# Patient Record
Sex: Female | Born: 1970 | Race: Black or African American | Hispanic: No | Marital: Married | Smoking: Former smoker
Health system: Southern US, Community
[De-identification: ages and names within clinical notes are randomized; demographics above are authoritative.]

## PROBLEM LIST (undated history)

## (undated) ENCOUNTER — Inpatient Hospital Stay (HOSPITAL_COMMUNITY): Payer: Self-pay

## (undated) DIAGNOSIS — D649 Anemia, unspecified: Secondary | ICD-10-CM

## (undated) DIAGNOSIS — F329 Major depressive disorder, single episode, unspecified: Secondary | ICD-10-CM

## (undated) DIAGNOSIS — M1710 Unilateral primary osteoarthritis, unspecified knee: Secondary | ICD-10-CM

## (undated) DIAGNOSIS — R7303 Prediabetes: Secondary | ICD-10-CM

## (undated) DIAGNOSIS — R0602 Shortness of breath: Secondary | ICD-10-CM

## (undated) DIAGNOSIS — F32A Depression, unspecified: Secondary | ICD-10-CM

## (undated) HISTORY — DX: Depression, unspecified: F32.A

## (undated) HISTORY — DX: Prediabetes: R73.03

## (undated) HISTORY — DX: Shortness of breath: R06.02

## (undated) HISTORY — DX: Anemia, unspecified: D64.9

## (undated) HISTORY — DX: Major depressive disorder, single episode, unspecified: F32.9

## (undated) HISTORY — DX: Unilateral primary osteoarthritis, unspecified knee: M17.10

## (undated) HISTORY — PX: SMALL INTESTINE SURGERY: SHX150

---

## 2012-03-24 ENCOUNTER — Ambulatory Visit: Payer: Self-pay | Admitting: Obstetrics and Gynecology

## 2012-03-24 ENCOUNTER — Encounter: Payer: Self-pay | Admitting: Obstetrics and Gynecology

## 2012-03-24 ENCOUNTER — Ambulatory Visit: Payer: BC Managed Care – PPO | Admitting: Obstetrics and Gynecology

## 2012-03-24 VITALS — BP 110/82 | Ht 69.0 in | Wt 213.0 lb

## 2012-03-24 DIAGNOSIS — N6320 Unspecified lump in the left breast, unspecified quadrant: Secondary | ICD-10-CM

## 2012-03-24 DIAGNOSIS — Z8639 Personal history of other endocrine, nutritional and metabolic disease: Secondary | ICD-10-CM | POA: Insufficient documentation

## 2012-03-24 DIAGNOSIS — Z124 Encounter for screening for malignant neoplasm of cervix: Secondary | ICD-10-CM

## 2012-03-24 DIAGNOSIS — N920 Excessive and frequent menstruation with regular cycle: Secondary | ICD-10-CM

## 2012-03-24 LAB — HEMOGLOBIN: Hemoglobin: 10.8 g/dL — ABNORMAL LOW (ref 12.0–15.0)

## 2012-03-24 NOTE — Progress Notes (Signed)
Last Pap: 12/2009 WNL: Yes Regular Periods:yes Contraception: condoms   Monthly Breast exam:yes Tetanus<49yrs:yes Nl.Bladder Function:yes Daily BMs:yes Healthy Diet:yes Calcium:yes Mammogram:no Exercise:yes Have often Exercise: 1 time a week  Seatbelt: yes Abuse at home: no Stressful work:no PCP: Dr.Alam  Change in PMH: unchanged Change in AVW:[id number] . Pt stated no issues today.  Subjective:    Pamela Gray is a 42 y.o. female, G2P0011, who presents for an annual exam.  Has fatigue and ice pica    History   Social History  . Marital Status: Single    Spouse Name: N/A    Number of Children: N/A  . Years of Education: N/A   Social History Main Topics  . Smoking status: Former Smoker    Types: Cigarettes  . Smokeless tobacco: Never Used  . Alcohol Use: 4.8 oz/week    7 Glasses of wine, 1 Shots of liquor per week  . Drug Use: No  . Sexually Active: Yes    Birth Control/ Protection: Condom   Other Topics Concern  . None   Social History Narrative  . None    Menstrual cycle:   LMP: Patient's last menstrual period was 02/27/2012.           Cycle: regular every 28 days with 5 days heavy bleeding.  No IM bleeding  The following portions of the patient's history were reviewed and updated as appropriate: allergies, current medications, past family history, past medical history, past social history, past surgical history and problem list.  Review of Systems Pertinent items are noted in HPI. Breast:Negative for breast lump,nipple discharge or nipple retraction Gastrointestinal: Negative for abdominal pain, change in bowel habits or rectal bleeding Urinary:negative   Objective:    BP 110/82  Ht 5\' 9"  (1.753 m)  Wt 213 lb (96.616 kg)  BMI 31.45 kg/m2  LMP 02/27/2012    Weight:  Wt Readings from Last 1 Encounters:  03/24/12 213 lb (96.616 kg)          BMI: Body mass index is 31.45 kg/(m^2).  General Appearance: Alert, appropriate appearance for age. No  acute distress HEENT: Grossly normal Neck / Thyroid: Supple, no masses, nodes or enlargement Lungs: clear to auscultation bilaterally Back: No CVA tenderness Breast Exam: No masses or nodes.No dimpling, nipple retraction or discharge on right.  On left at 8 o'clock at periphery, 8mm mobile nontender nodule Cardiovascular: Regular rate and rhythm. S1, S2, no murmur Gastrointestinal: Soft, non-tender, no masses or organomegaly Pelvic Exam: Vulva and vagina appear normal. Bimanual exam reveals normal uterus and adnexa. Rectovaginal: normal rectal, no masses Lymphatic Exam: Non-palpable nodes in neck, clavicular, axillary, or inguinal regions Skin: no rash or abnormalities Neurologic: Normal gait and speech, no tremor  Psychiatric: Alert and oriented, appropriate affect.   Wet Prep:not applicable Urinalysis:not applicable UPT: Not done   Assessment:    breast mass  Heavy menses   Plan:    Mammogram- diagnostic and possible breast U/S pap smear with HR HPV HGB return annually or prn STD screening: discussed Contraception:condoms

## 2012-03-25 LAB — PAP IG AND HPV HIGH-RISK

## 2012-03-28 ENCOUNTER — Other Ambulatory Visit: Payer: Self-pay

## 2012-03-28 DIAGNOSIS — D649 Anemia, unspecified: Secondary | ICD-10-CM

## 2012-04-06 ENCOUNTER — Ambulatory Visit
Admission: RE | Admit: 2012-04-06 | Discharge: 2012-04-06 | Disposition: A | Discharge status: Home / Self Care | Payer: BC Managed Care – PPO | Source: Ambulatory Visit | Attending: Obstetrics and Gynecology | Admitting: Obstetrics and Gynecology

## 2012-04-06 ENCOUNTER — Encounter: Payer: Self-pay | Admitting: Obstetrics and Gynecology

## 2012-04-06 ENCOUNTER — Other Ambulatory Visit: Payer: Self-pay | Admitting: Obstetrics and Gynecology

## 2012-04-06 DIAGNOSIS — N6320 Unspecified lump in the left breast, unspecified quadrant: Secondary | ICD-10-CM

## 2012-04-06 DIAGNOSIS — R921 Mammographic calcification found on diagnostic imaging of breast: Secondary | ICD-10-CM | POA: Insufficient documentation

## 2012-04-06 DIAGNOSIS — R922 Inconclusive mammogram: Secondary | ICD-10-CM | POA: Insufficient documentation

## 2012-05-09 ENCOUNTER — Other Ambulatory Visit: Payer: BC Managed Care – PPO

## 2012-10-07 ENCOUNTER — Encounter (HOSPITAL_COMMUNITY): Payer: Self-pay

## 2012-10-07 ENCOUNTER — Inpatient Hospital Stay (HOSPITAL_COMMUNITY)
Admission: AD | Admit: 2012-10-07 | Discharge: 2012-10-07 | Disposition: A | Discharge status: Home / Self Care | Payer: BC Managed Care – PPO | Source: Ambulatory Visit | Attending: Obstetrics and Gynecology | Admitting: Obstetrics and Gynecology

## 2012-10-07 DIAGNOSIS — D259 Leiomyoma of uterus, unspecified: Secondary | ICD-10-CM | POA: Insufficient documentation

## 2012-10-07 DIAGNOSIS — R109 Unspecified abdominal pain: Secondary | ICD-10-CM | POA: Insufficient documentation

## 2012-10-07 DIAGNOSIS — O99891 Other specified diseases and conditions complicating pregnancy: Secondary | ICD-10-CM | POA: Insufficient documentation

## 2012-10-07 DIAGNOSIS — O210 Mild hyperemesis gravidarum: Secondary | ICD-10-CM | POA: Insufficient documentation

## 2012-10-07 DIAGNOSIS — O3410 Maternal care for benign tumor of corpus uteri, unspecified trimester: Secondary | ICD-10-CM | POA: Insufficient documentation

## 2012-10-07 LAB — URINALYSIS, ROUTINE W REFLEX MICROSCOPIC
Bilirubin Urine: NEGATIVE
Glucose, UA: NEGATIVE mg/dL
Ketones, ur: NEGATIVE mg/dL
Leukocytes, UA: NEGATIVE
Protein, ur: NEGATIVE mg/dL
pH: 6 (ref 5.0–8.0)

## 2012-10-07 LAB — URINE MICROSCOPIC-ADD ON

## 2012-10-07 NOTE — MAU Provider Note (Signed)
  History     CSN: [id number]  Arrival date and time: 10/07/12 0041   None     Chief Complaint  Patient presents with  . Abdominal Cramping   HPI Comments: Pt is a G4P1 at [redacted]w[redacted]d by [place] w cc cramping, worsening today, denies any VB or LOF. Reports N/V improving today. States was at work all day and cramping increasing tonight, admits to not drinking much water.   Rv'd prenatal records, NOB w/u on 8/11. [place] on 7/23 = EDC 04/24/12, US showed several fibroids, largest >4cmx9cm, otherwise SIUP WNL     Abdominal Cramping      Past Medical History  Diagnosis Date  . Depression     Past Surgical History  Procedure Laterality Date  . Small intestine surgery    . Cesarean section      Family History  Problem Relation Age of Onset  . Hypertension Mother   . Hyperlipidemia Mother   . Stroke Father   . Hypertension Father   . Diabetes Father   . Prostate cancer Father   . Diabetes Maternal Grandmother   . Hypertension Maternal Grandmother   . Hyperlipidemia Maternal Grandmother   . Diabetes Maternal Grandfather   . Prostate cancer Maternal Grandfather   . Diabetes Paternal Grandfather   . Prostate cancer Paternal Grandfather   . Hypertension Paternal Grandfather     History  Substance Use Topics  . Smoking status: Former Smoker    Types: Cigarettes  . Smokeless tobacco: Never Used  . Alcohol Use: 4.8 oz/week    7 Glasses of wine, 1 Shots of liquor per week    Allergies: No Known Allergies  No prescriptions prior to admission    Review of Systems  All other systems reviewed and are negative.   Physical Exam   Blood pressure 130/82, pulse 97, temperature 97.9 F (36.6 C), temperature source Oral, resp. rate 18, height 5\' 8"  (1.727 m), weight 222 lb 6.4 oz (100.88 kg), last menstrual period 07/27/2012, SpO2 100.00%, unknown if currently breastfeeding.  Physical Exam  Nursing note and vitals reviewed. Constitutional: She is oriented to person, place, and time.  She appears well-developed and well-nourished. No distress.  NAD   HENT:  Head: Normocephalic.  Cardiovascular: Normal rate.   Respiratory: Effort normal.  GI: Soft. She exhibits no distension. There is no tenderness. There is no rebound and no guarding.  Genitourinary:  Deferred   Musculoskeletal: Normal range of motion.  Neurological: She is alert and oriented to person, place, and time.  Skin: Skin is warm and dry.  Psychiatric: She has a normal mood and affect. Her behavior is normal.    MAU Course  Procedures    Assessment and Plan  IUP at [redacted]w[redacted]d Multiple fibroids BSUS by me, FHR 160's and +FM, (pt and sig other reassured) Declines pelvic exam  dc'd home in stable condition w routine f/u as scheduled  rv'd s/s miscarriage Enc PO hydration Enc pt to call before come to [facility]    Martisha Toulouse M 10/07/2012, 8:24 AM

## 2012-10-07 NOTE — MAU Note (Signed)
Pt states some cramping that started around 11am. Had OB appointment on Tuesday and was told to expect cramping, but tonight it got much worse and was told to come in to MAU. Some vaginal discharge but no bleeding. Denies N/V

## 2013-03-21 ENCOUNTER — Other Ambulatory Visit: Payer: Self-pay

## 2013-03-29 ENCOUNTER — Other Ambulatory Visit: Payer: Self-pay

## 2013-03-30 ENCOUNTER — Other Ambulatory Visit: Payer: Self-pay | Admitting: Obstetrics and Gynecology

## 2013-03-31 ENCOUNTER — Ambulatory Visit (HOSPITAL_COMMUNITY)
Admission: RE | Admit: 2013-03-31 | Discharge: 2013-03-31 | Disposition: A | Discharge status: Home / Self Care | Payer: BC Managed Care – PPO | Source: Ambulatory Visit | Attending: Obstetrics and Gynecology | Admitting: Obstetrics and Gynecology

## 2013-03-31 VITALS — BP 130/88 | HR 90 | Wt 252.0 lb

## 2013-03-31 DIAGNOSIS — O26839 Pregnancy related renal disease, unspecified trimester: Secondary | ICD-10-CM | POA: Insufficient documentation

## 2013-03-31 DIAGNOSIS — O99810 Abnormal glucose complicating pregnancy: Secondary | ICD-10-CM | POA: Insufficient documentation

## 2013-03-31 DIAGNOSIS — Z87891 Personal history of nicotine dependence: Secondary | ICD-10-CM | POA: Insufficient documentation

## 2013-03-31 DIAGNOSIS — Z794 Long term (current) use of insulin: Secondary | ICD-10-CM | POA: Insufficient documentation

## 2013-03-31 DIAGNOSIS — O24419 Gestational diabetes mellitus in pregnancy, unspecified control: Secondary | ICD-10-CM

## 2013-03-31 DIAGNOSIS — O34219 Maternal care for unspecified type scar from previous cesarean delivery: Secondary | ICD-10-CM | POA: Insufficient documentation

## 2013-03-31 NOTE — Consult Note (Signed)
Maternal Fetal Medicine Consultation  Requesting Provider(s): Everett Graff, MD  Reason for consultation: Class B diabetes, new onset proteinuria - recommendations for timing of delivery  HPI: Pamela Gray is a 43 yo G3P1011 currently at 80 4/7 weeks who is referred for consultation and recommendations for timing of delivery due to class B diabetes and new onset gestational proteinuria.  The patient reports a history of gestational diabetes with her first pregnancy, but was never tested after delivery.  The patient was diagnosed with GDM at approximately 30 weeks - her HbA1C in December was 7.  She is currently on split dose insulin and is generally well-controlled.  The patient was recently noted to have proteinuria on urine dip - Pro/Creat ratio was 0.73 and 24 hr urine protein on 1/29 was 1848 mg/24 hrs.  The patient's blood pressures have been stable - mostly in the 120's/70's but have recently increased to 130's / 80's.  Preeclampsia labs on 2/4 were within normal limits except for a uric acid of 6.3.  Ultrasound on 2/4 showed an estimated fetal weight of 3515 g (95th %tile).  She is tentatively scheduled for repeat C-section at 39 weeks.  Pamela Gray reports some increased lower extremity swelling - denies HA, RUQ pain or visual changes.  She has been getting 2x weekly antepartum fetal testing that has been reassuring.  She is otherwise without complaints today.  OB History: OB History   Grav Para Term Preterm Abortions TAB SAB Ect Mult Living   4 1 1  1     1     G1- TAB age 44 G2 - C-section at term for non reassuring fetal tracing  PMH:  Past Medical History  Diagnosis Date  . Depression     PSH:  Past Surgical History  Procedure Laterality Date  . Small intestine surgery    . Cesarean section     Meds:  Novalin / Novalog insulin Protonix Benadryl Diclegis  Allergies: No Known Allergies  FH:  Family History  Problem Relation Age of Onset  . Hypertension Mother    . Hyperlipidemia Mother   . Stroke Father   . Hypertension Father   . Diabetes Father   . Prostate cancer Father   . Diabetes Maternal Grandmother   . Hypertension Maternal Grandmother   . Hyperlipidemia Maternal Grandmother   . Diabetes Maternal Grandfather   . Prostate cancer Maternal Grandfather   . Diabetes Paternal Grandfather   . Prostate cancer Paternal Grandfather   . Hypertension Paternal Grandfather    Soc:  History   Social History  . Marital Status: Married    Spouse Name: N/A    Number of Children: N/A  . Years of Education: N/A   Occupational History  . Not on file.   Social History Main Topics  . Smoking status: Former Smoker    Types: Cigarettes  . Smokeless tobacco: Never Used  . Alcohol Use: 4.8 oz/week    7 Glasses of wine, 1 Shots of liquor per week  . Drug Use: No  . Sexual Activity: Yes    Birth Control/ Protection: Condom   Other Topics Concern  . Not on file   Social History Narrative  . No narrative on file    Review of Systems: no vaginal bleeding or cramping/contractions, no LOF, no nausea/vomiting. All other systems reviewed and are negative.  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  EFW > 90th %tile on most recent ultrasound.         3) Gestational proteinuria - do not have baseline 24-hr urine, but this is likely a new finding. BPs to date have been stable, but I would suspect that the patient is developing preeclampsia given new onset proteinuria and a mildly elevated uric acid.         4) Hx of prior C-section, desires repeat  Recommendations: 1) Continue close outpatient follow up- BP checks at least twice weekly.  The patient is to be seen in clinic on Monday for BP checks.  If SBP > 140 or DBP > 90 persistently, would recommend moving toward delivery anytime  after 37 0/7 weeks.  If there is any question prior to 37 weeks, would have a low threshold to admit for inpatient observation. 2) 2x weekly NSTs with weekly AFIs  3) Would check weekly preeclampsia labs - would move toward delivery for elevated LFTs, plts < 100, Creat > 1.2 4) If testing and BPs otherwise remain stable, would deliver no later than 39 weeks.  Preeclampsia precautions given - to come to MAU or clinic for HA, visual changes or RUQ pain.   Thank you for the opportunity to be a part of the care of [facility]. Please contact our office if we can be of further assistance.   I spent approximately 30 minutes with this patient with over 50% of time spent in face-to-face counseling.  Benjaman Lobe, MD Maternal Fetal Medicine

## 2013-04-03 ENCOUNTER — Encounter (HOSPITAL_COMMUNITY): Payer: Self-pay | Admitting: Pharmacist

## 2013-04-04 ENCOUNTER — Inpatient Hospital Stay (HOSPITAL_COMMUNITY): Payer: BC Managed Care – PPO | Admitting: Anesthesiology

## 2013-04-04 ENCOUNTER — Encounter (HOSPITAL_COMMUNITY): Payer: BC Managed Care – PPO | Admitting: Anesthesiology

## 2013-04-04 ENCOUNTER — Inpatient Hospital Stay (HOSPITAL_COMMUNITY)
Admission: AD | Admit: 2013-04-04 | Discharge: 2013-04-08 | DRG: 765 | Disposition: A | Discharge status: Home / Self Care | Payer: BC Managed Care – PPO | Source: Ambulatory Visit | Attending: Obstetrics and Gynecology | Admitting: Obstetrics and Gynecology

## 2013-04-04 ENCOUNTER — Encounter (HOSPITAL_COMMUNITY)
Admission: AD | Disposition: A | Discharge status: Home / Self Care | Payer: Self-pay | Source: Ambulatory Visit | Attending: Obstetrics and Gynecology

## 2013-04-04 ENCOUNTER — Encounter (HOSPITAL_COMMUNITY): Payer: Self-pay | Admitting: *Deleted

## 2013-04-04 DIAGNOSIS — E669 Obesity, unspecified: Secondary | ICD-10-CM | POA: Diagnosis present

## 2013-04-04 DIAGNOSIS — D649 Anemia, unspecified: Secondary | ICD-10-CM | POA: Diagnosis present

## 2013-04-04 DIAGNOSIS — O99214 Obesity complicating childbirth: Secondary | ICD-10-CM

## 2013-04-04 DIAGNOSIS — IMO0002 Reserved for concepts with insufficient information to code with codable children: Secondary | ICD-10-CM | POA: Diagnosis not present

## 2013-04-04 DIAGNOSIS — F3289 Other specified depressive episodes: Secondary | ICD-10-CM | POA: Diagnosis present

## 2013-04-04 DIAGNOSIS — F329 Major depressive disorder, single episode, unspecified: Secondary | ICD-10-CM | POA: Diagnosis present

## 2013-04-04 DIAGNOSIS — O3410 Maternal care for benign tumor of corpus uteri, unspecified trimester: Secondary | ICD-10-CM

## 2013-04-04 DIAGNOSIS — O09529 Supervision of elderly multigravida, unspecified trimester: Secondary | ICD-10-CM | POA: Diagnosis present

## 2013-04-04 DIAGNOSIS — O24419 Gestational diabetes mellitus in pregnancy, unspecified control: Secondary | ICD-10-CM | POA: Diagnosis present

## 2013-04-04 DIAGNOSIS — O1490 Unspecified pre-eclampsia, unspecified trimester: Secondary | ICD-10-CM | POA: Diagnosis present

## 2013-04-04 DIAGNOSIS — O99344 Other mental disorders complicating childbirth: Secondary | ICD-10-CM | POA: Diagnosis present

## 2013-04-04 DIAGNOSIS — O9852 Other viral diseases complicating childbirth: Secondary | ICD-10-CM

## 2013-04-04 DIAGNOSIS — O34599 Maternal care for other abnormalities of gravid uterus, unspecified trimester: Secondary | ICD-10-CM | POA: Diagnosis present

## 2013-04-04 DIAGNOSIS — Z794 Long term (current) use of insulin: Secondary | ICD-10-CM

## 2013-04-04 DIAGNOSIS — Z6836 Body mass index (BMI) 36.0-36.9, adult: Secondary | ICD-10-CM

## 2013-04-04 DIAGNOSIS — O34219 Maternal care for unspecified type scar from previous cesarean delivery: Principal | ICD-10-CM | POA: Diagnosis present

## 2013-04-04 DIAGNOSIS — D4959 Neoplasm of unspecified behavior of other genitourinary organ: Secondary | ICD-10-CM | POA: Diagnosis present

## 2013-04-04 DIAGNOSIS — O321XX0 Maternal care for breech presentation, not applicable or unspecified: Secondary | ICD-10-CM | POA: Diagnosis present

## 2013-04-04 DIAGNOSIS — O99814 Abnormal glucose complicating childbirth: Secondary | ICD-10-CM | POA: Diagnosis present

## 2013-04-04 DIAGNOSIS — O9902 Anemia complicating childbirth: Secondary | ICD-10-CM | POA: Diagnosis present

## 2013-04-04 DIAGNOSIS — Z302 Encounter for sterilization: Secondary | ICD-10-CM

## 2013-04-04 DIAGNOSIS — Z98891 History of uterine scar from previous surgery: Secondary | ICD-10-CM

## 2013-04-04 DIAGNOSIS — D259 Leiomyoma of uterus, unspecified: Secondary | ICD-10-CM | POA: Diagnosis present

## 2013-04-04 DIAGNOSIS — B069 Rubella without complication: Secondary | ICD-10-CM | POA: Diagnosis present

## 2013-04-04 HISTORY — PX: BILATERAL SALPINGECTOMY: SHX5743

## 2013-04-04 LAB — CBC
HEMATOCRIT: 25 % — AB (ref 36.0–46.0)
HEMOGLOBIN: 7.8 g/dL — AB (ref 12.0–15.0)
MCH: 19.1 pg — ABNORMAL LOW (ref 26.0–34.0)
MCHC: 31.2 g/dL (ref 30.0–36.0)
MCV: 61.3 fL — ABNORMAL LOW (ref 78.0–100.0)
Platelets: 466 10*3/uL — ABNORMAL HIGH (ref 150–400)
RBC: 4.08 MIL/uL (ref 3.87–5.11)
RDW: 19.3 % — ABNORMAL HIGH (ref 11.5–15.5)
WBC: 10.2 10*3/uL (ref 4.0–10.5)

## 2013-04-04 LAB — PROTEIN / CREATININE RATIO, URINE
CREATININE, URINE: 253.29 mg/dL
PROTEIN CREATININE RATIO: 1.18 — AB (ref 0.00–0.15)
Total Protein, Urine: 298.9 mg/dL

## 2013-04-04 LAB — COMPREHENSIVE METABOLIC PANEL
ALT: 8 U/L (ref 0–35)
AST: 21 U/L (ref 0–37)
Albumin: 2.7 g/dL — ABNORMAL LOW (ref 3.5–5.2)
Alkaline Phosphatase: 94 U/L (ref 39–117)
BUN: 8 mg/dL (ref 6–23)
CALCIUM: 8.9 mg/dL (ref 8.4–10.5)
CO2: 21 mEq/L (ref 19–32)
CREATININE: 0.68 mg/dL (ref 0.50–1.10)
Chloride: 101 mEq/L (ref 96–112)
GFR calc non Af Amer: >90 mL/min (ref >90)
GLUCOSE: 72 mg/dL (ref 70–99)
Potassium: 3.6 mEq/L — ABNORMAL LOW (ref 3.7–5.3)
SODIUM: 135 meq/L — AB (ref 137–147)
TOTAL PROTEIN: 6.7 g/dL (ref 6.0–8.3)
Total Bilirubin: 0.6 mg/dL (ref 0.3–1.2)

## 2013-04-04 LAB — LACTATE DEHYDROGENASE: LDH: 204 U/L (ref 94–250)

## 2013-04-04 LAB — GLUCOSE, CAPILLARY: Glucose-Capillary: 62 mg/dL — ABNORMAL LOW (ref 70–99)

## 2013-04-04 LAB — PREPARE RBC (CROSSMATCH)

## 2013-04-04 LAB — ABO/RH: ABO/RH(D): O POS

## 2013-04-04 LAB — URIC ACID: URIC ACID, SERUM: 6.4 mg/dL (ref 2.4–7.0)

## 2013-04-04 SURGERY — Surgical Case
Anesthesia: Spinal | Site: Abdomen

## 2013-04-04 MED ORDER — MAGNESIUM SULFATE BOLUS VIA INFUSION
4.0000 g | Freq: Once | INTRAVENOUS | Status: AC
  Administered 2013-04-05: 4 g via INTRAVENOUS
  Filled 2013-04-04: qty 500

## 2013-04-04 MED ORDER — CEFAZOLIN SODIUM-DEXTROSE 2-3 GM-% IV SOLR
2.0000 g | INTRAVENOUS | Status: AC
  Administered 2013-04-04: 2 g via INTRAVENOUS
  Filled 2013-04-04: qty 50

## 2013-04-04 MED ORDER — KETOROLAC TROMETHAMINE 30 MG/ML IJ SOLN
30.0000 mg | Freq: Four times a day (QID) | INTRAMUSCULAR | Status: AC | PRN
  Administered 2013-04-05: 30 mg via INTRAMUSCULAR

## 2013-04-04 MED ORDER — OXYTOCIN 10 UNIT/ML IJ SOLN
INTRAMUSCULAR | Status: AC
  Filled 2013-04-04: qty 4

## 2013-04-04 MED ORDER — PHENYLEPHRINE 8 MG IN D5W 100 ML (0.08MG/ML) PREMIX OPTIME
INJECTION | INTRAVENOUS | Status: DC | PRN
  Administered 2013-04-04: 60 ug/min via INTRAVENOUS

## 2013-04-04 MED ORDER — MORPHINE SULFATE 0.5 MG/ML IJ SOLN
INTRAMUSCULAR | Status: AC
  Filled 2013-04-04: qty 10

## 2013-04-04 MED ORDER — OXYTOCIN 10 UNIT/ML IJ SOLN
40.0000 [IU] | INTRAVENOUS | Status: DC | PRN
  Administered 2013-04-04 (×2): 40 [IU] via INTRAVENOUS

## 2013-04-04 MED ORDER — PHENYLEPHRINE 40 MCG/ML (10ML) SYRINGE FOR IV PUSH (FOR BLOOD PRESSURE SUPPORT)
PREFILLED_SYRINGE | INTRAVENOUS | Status: AC
  Filled 2013-04-04: qty 5

## 2013-04-04 MED ORDER — CITRIC ACID-SODIUM CITRATE 334-500 MG/5ML PO SOLN
30.0000 mL | Freq: Once | ORAL | Status: DC
  Filled 2013-04-04: qty 15

## 2013-04-04 MED ORDER — MEPERIDINE HCL 25 MG/ML IJ SOLN: 6.2500 mg | INTRAMUSCULAR | Status: DC | PRN

## 2013-04-04 MED ORDER — KETOROLAC TROMETHAMINE 30 MG/ML IJ SOLN: 30.0000 mg | Freq: Four times a day (QID) | INTRAMUSCULAR | Status: AC | PRN

## 2013-04-04 MED ORDER — MORPHINE SULFATE (PF) 0.5 MG/ML IJ SOLN
INTRAMUSCULAR | Status: DC | PRN
  Administered 2013-04-04: .15 mg via EPIDURAL

## 2013-04-04 MED ORDER — BUPIVACAINE IN DEXTROSE 0.75-8.25 % IT SOLN
INTRATHECAL | Status: AC
  Filled 2013-04-04: qty 2

## 2013-04-04 MED ORDER — SCOPOLAMINE 1 MG/3DAYS TD PT72
1.0000 | MEDICATED_PATCH | Freq: Once | TRANSDERMAL | Status: AC
  Administered 2013-04-05: 1.5 mg via TRANSDERMAL

## 2013-04-04 MED ORDER — FENTANYL CITRATE 0.05 MG/ML IJ SOLN
INTRAMUSCULAR | Status: AC
  Filled 2013-04-04: qty 2

## 2013-04-04 MED ORDER — KETOROLAC TROMETHAMINE 30 MG/ML IJ SOLN
INTRAMUSCULAR | Status: AC
  Administered 2013-04-05: 30 mg via INTRAMUSCULAR
  Filled 2013-04-04: qty 1

## 2013-04-04 MED ORDER — SODIUM BICARBONATE 8.4 % IV SOLN
INTRAVENOUS | Status: DC | PRN
  Administered 2013-04-04: 2 mL via EPIDURAL

## 2013-04-04 MED ORDER — MAGNESIUM SULFATE 40 G IN LACTATED RINGERS - SIMPLE
2.0000 g/h | INTRAVENOUS | Status: AC
  Filled 2013-04-04: qty 500

## 2013-04-04 MED ORDER — LACTATED RINGERS IV SOLN
INTRAVENOUS | Status: DC | PRN
  Administered 2013-04-04 (×3): via INTRAVENOUS

## 2013-04-04 MED ORDER — SCOPOLAMINE 1 MG/3DAYS TD PT72
MEDICATED_PATCH | TRANSDERMAL | Status: AC
  Administered 2013-04-05: 1.5 mg via TRANSDERMAL
  Filled 2013-04-04: qty 1

## 2013-04-04 MED ORDER — LABETALOL HCL 5 MG/ML IV SOLN: 10.0000 mg | Freq: Once | INTRAVENOUS | Status: DC

## 2013-04-04 MED ORDER — HYDROMORPHONE HCL PF 1 MG/ML IJ SOLN
0.2500 mg | INTRAMUSCULAR | Status: DC | PRN
  Administered 2013-04-05 (×2): 0.5 mg via INTRAVENOUS

## 2013-04-04 MED ORDER — CITRIC ACID-SODIUM CITRATE 334-500 MG/5ML PO SOLN
ORAL | Status: AC
  Filled 2013-04-04: qty 15

## 2013-04-04 MED ORDER — ONDANSETRON HCL 4 MG/2ML IJ SOLN
INTRAMUSCULAR | Status: DC | PRN
  Administered 2013-04-04: 4 mg via INTRAVENOUS

## 2013-04-04 MED ORDER — DEXTROSE-NACL 5-0.45 % IV SOLN
INTRAVENOUS | Status: DC
  Administered 2013-04-04 – 2013-04-05 (×3): via INTRAVENOUS

## 2013-04-04 MED ORDER — BUPIVACAINE-EPINEPHRINE 0.5% -1:200000 IJ SOLN
INTRAMUSCULAR | Status: DC | PRN
  Administered 2013-04-04: 10 mL

## 2013-04-04 SURGICAL SUPPLY — 39 items
CLAMP CORD UMBIL (MISCELLANEOUS) IMPLANT
CLOTH BEACON ORANGE TIMEOUT ST (SAFETY) ×4 IMPLANT
CONTAINER PREFILL 10% NBF 15ML (MISCELLANEOUS) IMPLANT
DRAIN JACKSON PRT FLT 7MM (DRAIN) IMPLANT
DRAPE LG THREE QUARTER DISP (DRAPES) IMPLANT
DRSG OPSITE POSTOP 4X10 (GAUZE/BANDAGES/DRESSINGS) ×4 IMPLANT
DURAPREP 26ML APPLICATOR (WOUND CARE) ×4 IMPLANT
ELECT REM PT RETURN 9FT ADLT (ELECTROSURGICAL) ×4
ELECTRODE REM PT RTRN 9FT ADLT (ELECTROSURGICAL) ×2 IMPLANT
EVACUATOR SILICONE 100CC (DRAIN) IMPLANT
EXTRACTOR VACUUM M CUP 4 TUBE (SUCTIONS) IMPLANT
EXTRACTOR VACUUM M CUP 4' TUBE (SUCTIONS)
GLOVE BIOGEL PI IND STRL 8.5 (GLOVE) ×2 IMPLANT
GLOVE BIOGEL PI INDICATOR 8.5 (GLOVE) ×2
GLOVE ECLIPSE 8.0 STRL XLNG CF (GLOVE) ×8 IMPLANT
GOWN STRL REUS W/TWL LRG LVL3 (GOWN DISPOSABLE) ×12 IMPLANT
KIT ABG SYR 3ML LUER SLIP (SYRINGE) IMPLANT
NEEDLE HYPO 25X1 1.5 SAFETY (NEEDLE) ×4 IMPLANT
NEEDLE HYPO 25X5/8 SAFETYGLIDE (NEEDLE) IMPLANT
PACK C SECTION WH (CUSTOM PROCEDURE TRAY) ×4 IMPLANT
PAD ABD 7.5X8 STRL (GAUZE/BANDAGES/DRESSINGS) ×4 IMPLANT
PAD OB MATERNITY 4.3X12.25 (PERSONAL CARE ITEMS) ×4 IMPLANT
RINGERS IRRIG 1000ML POUR BTL (IV SOLUTION) ×4 IMPLANT
STAPLER VISISTAT 35W (STAPLE) IMPLANT
SUT MNCRL AB 3-0 PS2 27 (SUTURE) ×4 IMPLANT
SUT PLAIN 0 NONE (SUTURE) ×4 IMPLANT
SUT SILK 3 0 FS 1X18 (SUTURE) IMPLANT
SUT VIC AB 0 CT1 27 (SUTURE) ×6
SUT VIC AB 0 CT1 27XBRD ANBCTR (SUTURE) ×6 IMPLANT
SUT VIC AB 2-0 CTX 36 (SUTURE) ×8 IMPLANT
SUT VIC AB 3-0 CT1 27 (SUTURE)
SUT VIC AB 3-0 CT1 TAPERPNT 27 (SUTURE) IMPLANT
SUT VIC AB 3-0 SH 27 (SUTURE)
SUT VIC AB 3-0 SH 27X BRD (SUTURE) IMPLANT
SYR CONTROL 10ML LL (SYRINGE) ×4 IMPLANT
TAPE CLOTH SURG 4X10 WHT LF (GAUZE/BANDAGES/DRESSINGS) ×4 IMPLANT
TOWEL OR 17X24 6PK STRL BLUE (TOWEL DISPOSABLE) ×4 IMPLANT
TRAY FOLEY CATH 14FR (SET/KITS/TRAYS/PACK) ×4 IMPLANT
WATER STERILE IRR 1000ML POUR (IV SOLUTION) IMPLANT

## 2013-04-04 NOTE — Transfer of Care (Signed)
Immediate Anesthesia Transfer of Care Note  Patient: [facility]  Procedure(s) Performed: Procedure(s): CESAREAN SECTION (N/A) BILATERAL SALPINGECTOMY  Patient Location: PACU  Anesthesia Type:Spinal  Level of Consciousness: awake, alert  and patient cooperative  Airway & Oxygen Therapy: Patient Spontanous Breathing  Post-op Assessment: Report given to PACU RN and Post -op Vital signs reviewed and stable  Post vital signs: Reviewed and stable  Complications: No apparent anesthesia complications

## 2013-04-04 NOTE — Anesthesia Procedure Notes (Signed)
Spinal  Patient location during procedure: OR Preanesthetic Checklist Completed: patient identified, site marked, surgical consent, pre-op evaluation, timeout performed, IV checked, risks and benefits discussed and monitors and equipment checked Spinal Block Patient position: sitting Prep: DuraPrep Patient monitoring: cardiac monitor, continuous pulse ox, blood pressure and heart rate Approach: midline Location: L3-4 Injection technique: catheter Needle Needle type: Tuohy and Sprotte  Needle gauge: 24 G Needle length: 12.7 cm Needle insertion depth: 8 cm Catheter type: closed end flexible Catheter size: 19 g Catheter at skin depth: 15 cm Additional Notes Spinal Dosage in OR  Bupivicaine ml       1.9 PFMS04   mcg        150 (-) asp Heme/CSF

## 2013-04-04 NOTE — Progress Notes (Signed)
Pt states she started seeing stars about 2 weeks ago

## 2013-04-04 NOTE — H&P (Signed)
Admission history and physical exam 04/04/13  [name] is a 43 y.o. female, G4P1011, at [redacted]w[redacted]d gestation, who presents for evaluation of blood pressure. She has been followed at the [facility]cology division of [facility]. Her pregnancy has been complicated by  Proteinuria  Prior cesarean section  Bowel obstruction after cesarean section requiring surgery  Advanced maternal age  Anemia  Breech presentation  Gestational Diabetes requiring insulin  Obesity  Depression  Rubella nonimmune  Desires sterilization.  The patient denies headaches, blurred vision, and right upper quadrant tenderness. Her urine protein to creatinine ratio 03/20/2013 was 0.73. Her urine protein to creatinine ratio on 03/29/2013 was 0.5. Her hemoglobin on 03/29/2013 was 8.2. She currently uses insulin to manage her blood sugars. Her hemoglobin A1c was 7 when last checked. She had gestational diabetes with her first pregnancy. She says that she was checked for diabetes between pregnancies and was always told that her tests were normal. She has had antenatal testing that confirms fetal well-being. Her blood pressure in the office today was 148/90. This was after an active day. Her mother says that her blood pressures have been elevated at home using the automated blood pressure cuff.  See history below.  OB History    Grav  Para  Term  Preterm  Abortions  TAB  SAB  Ect  Mult  Living    4  1  1   1      1       Past Medical History   Diagnosis  Date   .  Depression     Prescriptions prior to admission   Medication  Sig  Dispense  Refill   .  diphenhydrAMINE (SOMINEX) 25 MG tablet  Take 25 mg by mouth at bedtime as needed for sleep.     .  insulin aspart (NOVOLOG) 100 UNIT/ML injection  Inject into the skin 3 (three) times daily before meals.     .  insulin lispro (HUMALOG) 100 UNIT/ML injection  Inject into the skin 3 (three) times daily before meals.     .   flintstones complete (FLINTSTONES) 60 MG chewable tablet  Chew 1 tablet by mouth daily.      Past Surgical History   Procedure  Laterality  Date   .  Small intestine surgery     .  Cesarean section      No Known Allergies  Family History: family history includes Diabetes in her father, maternal grandfather, maternal grandmother, and paternal grandfather; Hyperlipidemia in her maternal grandmother and mother; Hypertension in her father, maternal grandmother, mother, and paternal grandfather; Prostate cancer in her father, maternal grandfather, and paternal grandfather; Stroke in her father.  Social History: reports that she has quit smoking. Her smoking use included Cigarettes. She smoked 0.00 packs per day. She has never used smokeless tobacco. She reports that she drinks about 4.8 ounces of alcohol per week. She reports that she does not use illicit drugs.  Review of systems: Normal pregnancy complaints.  Admission Physical Exam:  Body mass index is 36.99 kg/(m^2).  Height 5\' 9"  (1.753 m), weight 250 lb 9.6 oz (113.671 kg), last menstrual period 07/27/2012, unknown if currently breastfeeding.  HEENT: Within normal limits  Chest: Clear  Heart: Regular rate and rhythm  Abdomen: Gravid and nontender  Extremities: Grossly normal  Neurologic exam: Grossly normal  Prenatal labs:  ABO, Rh: O+  HBsAg: NR  HIV: NR  GBS: Neg  Antibody: Neg  Rubella:  Non-Immune  RPR: NR  Fetal heart rate: Category 1     Prenatal Transfer Tool   Maternal Diabetes: Yes: Diabetes Type: Insulin/Medication controlled  Genetic Screening: Normal  Maternal Ultrasounds/Referrals: Breech, fibroids  Fetal Ultrasounds or other Referrals: None  Maternal Substance Abuse: No  Significant Maternal Medications: Meds include: Other: Insulin  Significant Maternal Lab Results: See HPI and results  Other Comments: TDAP given  Assessment:  [redacted]w[redacted]d gestation  Proteinuria  Prior cesarean section  Bowel obstruction after  cesarean section requiring surgery  Advanced maternal age  Anemia  Breech presentation  Gestational Diabetes requiring insulin  Obesity  Depression  Rubella nonimmune  Desires sterilization.  Plan:  We will check [place] labs including a protein creatinine ratio. If the patient needs the criteria for preeclampsia, then we will proceed with cesarean delivery and tubal sterilization by way of salpingectomy tonight. If the patient does not meet the criteria, then we will plan to do her cesarean section as scheduled. We will continue antenatal surveillance.  Floriene Jeschke V  04/04/2013, 5:01 PM

## 2013-04-04 NOTE — MAU Note (Signed)
Pt states her blood pressure has been elevated for the past 3 days

## 2013-04-04 NOTE — Progress Notes (Signed)
BP 131/82  Pulse 83  Ht 5\' 9"  (1.753 m)  Wt 250 lb 9.6 oz (113.671 kg)  BMI 36.99 kg/m2  LMP 07/27/2012  Results for orders placed during the hospital encounter of 04/04/13 (from the past 24 hour(s))  PROTEIN / CREATININE RATIO, URINE     Status: Abnormal   Collection Time    04/04/13  4:15 PM      Result Value Range   Creatinine, Urine 253.29     Total Protein, Urine 298.9     PROTEIN CREATININE RATIO 1.18 (*) 0.00 - 0.15  COMPREHENSIVE METABOLIC PANEL     Status: Abnormal   Collection Time    04/04/13  5:12 PM      Result Value Range   Sodium 135 (*) 137 - 147 mEq/L   Potassium 3.6 (*) 3.7 - 5.3 mEq/L   Chloride 101  96 - 112 mEq/L   CO2 21  19 - 32 mEq/L   Glucose, Bld 72  70 - 99 mg/dL   BUN 8  6 - 23 mg/dL   Creatinine, Ser 0.68  0.50 - 1.10 mg/dL   Calcium 8.9  8.4 - 10.5 mg/dL   Total Protein 6.7  6.0 - 8.3 g/dL   Albumin 2.7 (*) 3.5 - 5.2 g/dL   AST 21  0 - 37 U/L   ALT 8  0 - 35 U/L   Alkaline Phosphatase 94  39 - 117 U/L   Total Bilirubin 0.6  0.3 - 1.2 mg/dL   GFR calc non Af Amer >90  >90 mL/min   GFR calc Af Amer >90  >90 mL/min  CBC     Status: Abnormal   Collection Time    04/04/13  5:12 PM      Result Value Range   WBC 10.2  4.0 - 10.5 K/uL   RBC 4.08  3.87 - 5.11 MIL/uL   Hemoglobin 7.8 (*) 12.0 - 15.0 g/dL   HCT 25.0 (*) 36.0 - 46.0 %   MCV 61.3 (*) 78.0 - 100.0 fL   MCH 19.1 (*) 26.0 - 34.0 pg   MCHC 31.2  30.0 - 36.0 g/dL   RDW 19.3 (*) 11.5 - 15.5 %   Platelets 466 (*) 150 - 400 K/uL  LACTATE DEHYDROGENASE     Status: None   Collection Time    04/04/13  5:12 PM      Result Value Range   LDH 204  94 - 250 U/L  URIC ACID     Status: None   Collection Time    04/04/13  5:12 PM      Result Value Range   Uric Acid, Serum 6.4  2.4 - 7.0 mg/dL   Protein to creatinine ratio has doubled over the past week. The blood pressures continued to fluctuate. Treatment options were reviewed with the patient. Risk and benefits were outlined. She wants to  proceed with cesarean delivery at this time. She will also have a tubal sterilization procedure.  Dr. Raphael Gibney

## 2013-04-04 NOTE — Anesthesia Preprocedure Evaluation (Addendum)
Anesthesia Evaluation  Patient identified by MRN, date of birth, ID band Patient awake    Reviewed: Allergy & Precautions, H&P , Patient's Chart, lab work & pertinent test results  Airway Mallampati: II TM Distance: >3 FB Neck ROM: full    Dental no notable dental hx.    Pulmonary former smoker,  breath sounds clear to auscultation  Pulmonary exam normal       Cardiovascular Exercise Tolerance: Good Rhythm:regular Rate:Normal     Neuro/Psych    GI/Hepatic   Endo/Other  diabetesMorbid obesity  Renal/GU      Musculoskeletal   Abdominal   Peds  Hematology  (+) anemia ,   Anesthesia Other Findings   Reproductive/Obstetrics                          Anesthesia Physical Anesthesia Plan  ASA: III  Anesthesia Plan: Spinal   Post-op Pain Management:    Induction:   Airway Management Planned:   Additional Equipment:   Intra-op Plan:   Post-operative Plan:   Informed Consent: I have reviewed the patients History and Physical, chart, labs and discussed the procedure including the risks, benefits and alternatives for the proposed anesthesia with the patient or authorized representative who has indicated his/her understanding and acceptance.   Dental Advisory Given  Plan Discussed with: CRNA  Anesthesia Plan Comments: (Lab work confirmed with CRNA in room. Platelets okay. Discussed spinal anesthetic, and patient consents to the procedure:  included risk of possible headache,backache, failed block, allergic reaction, and nerve injury. This patient was asked if she had any questions or concerns before the procedure started. )        Anesthesia Quick Evaluation

## 2013-04-04 NOTE — Op Note (Addendum)
OPERATIVE NOTE  Patient's Name: Pamela Gray  Date of Birth: [birth year]   Medical Records Number: [id number]   Date of Operation: 04/04/2013   Preoperative diagnosis:  [redacted]w[redacted]d gestation  Preeclampsia Proteinuria  Prior cesarean section - desires repeat cesarean section  Bowel obstruction after cesarean section requiring surgery  Advanced maternal age  Anemia  Breech presentation  Gestational Diabetes requiring insulin  Obesity  Fibroid uterus  Depression  Rubella nonimmune  Desires sterilization.   Postoperative diagnosis:  [redacted]w[redacted]d gestation  Proteinuria Preeclampsia  Prior cesarean section  Bowel obstruction after cesarean section requiring surgery  Advanced maternal age  Anemia  Breech presentation  Gestational Diabetes requiring insulin  Obesity Fibroid uterus  Depression  Rubella nonimmune  Desires sterilization Pelvic adhesions.   Procedure:  Repeat low transverse cesarean section Bilateral tubal sterilization procedure by way of bilateral salpingectomy  Surgeon:  Gildardo Cranker, M.D.  Assistant:   Donnel Saxon , certified nurse midwife  Anesthesia:  Spinal   Disposition:  Pamela Gray is a 43 y.o. female, [redacted]w[redacted]d, who presents at [redacted]w[redacted]d weeks gestation. The patient has been followed at the [facility] obstetrics and gynecology division of [place] [facility]. This pregnancy has been complicated by a prior cesarean section and preeclampsia. The patient desires a repeat cesarean section. She understands the indications for her procedure and she accepts the risk of, but not limited to, anesthetic complications, bleeding, infections, and possible damage to the surrounding organs. The patient desires sterilization. Sterilization options were reviewed. The patient elects to have a bilateral salpingectomy because the potential benefit of cancer reduction. She understands that there is a small failure rate associated with tubal  sterilization.  Findings:  A   female  (Pamela Gray) was delivered from a  complete breech  position.  The Apgar scores were 9/9. The uterus, fallopian tubes, and ovaries were normal for the gravid state except for several fibroids in the fundus of the uterus. There were adhesions between the bowel and the fundus of the uterus.  Procedure:  The patient was taken to the operating room where a spinal anesthetic was given.The perineum was prepped with betadine. A Foley catheter was placed in the bladder.The patient's abdomen was prepped with Duraprep.   The patient was sterilely draped. A "timeout" was performed which properly identified the patient and the correct operative procedure. The lower abdomen was injected with half percent Marcaine with epinephrine. A low transverse incision was made in the abdomen and carried sharply through the subcutaneous tissue, the fascia, and the anterior peritoneum. An incision was made in the lower uterine segment. The incision was extended in a low transverse fashion. The membranes were ruptured. The baby  was delivered without difficulty from a complete breech position. The mouth and nose were suctioned.  The cord was clamped and cut. The infant was handed to the awaiting pediatric team. The placenta was removed. The uterine cavity was cleaned of amniotic fluid, clotted blood, and membranes. The uterine incision was closed using a running locking suture of 2-0 Vicryl. An imbricating suture of 2-0 Vicryl was placed. The pelvis was vigorously irrigated. Hemostasis was adequate. The left fallopian tube was identified and followed to its fimbriated end. The mesosalpinx was clamped and cut. A free tie and then a suture ligature were then placed. Hemostasis was noted to be adequate. An identical procedure was carried out on the opposite side. Hemostasis was thought to be adequate. The anterior peritoneum and the abdominal musculature were closed using 2-0 Vicryl.  The fascia was closed  using a running suture of 0 Vicryl followed by 3 interrupted sutures of 0 Vicryl. The subcutaneous layer was closed using interrupted sutures of 2-0 Vicryl. The skin was reapproximated using a subcuticular suture of 3-0 Monocryl. Sponge, needle, and instrument counts were correct on 2 occasions. The estimated blood loss for the procedure was 1100 cc. The patient tolerated her procedure well. She was transported to the recovery room in stable condition. The infant remained in the operating room with the mother for bonding and skin to skin. The placenta was sent to pathology. Both fallopian tubes were sent to pathology.  Gildardo Cranker, M.D.

## 2013-04-04 NOTE — MAU Provider Note (Signed)
Maternity admissions consult note   For an Obstetrics Patient  [name] is a 43 y.o. female, G4P1011, at [redacted]w[redacted]d gestation, who presents for evaluation of blood pressure. She has been followed at the [facility] and [facility]necology division of [facility].  Her pregnancy has been complicated by   Proteinuria  Prior cesarean section  Bowel obstruction after cesarean section requiring surgery  Advanced maternal age  Anemia  Breech presentation  Gestational Diabetes requiring insulin  Obesity  Depression  Rubella nonimmune  Desires sterilization.  The patient denies headaches, blurred vision, and right upper quadrant tenderness. Her urine protein to creatinine ratio 03/20/2013 was 0.73. Her urine protein to creatinine ratio on 03/29/2013 was 0.5. Her hemoglobin on 03/29/2013 was 8.2. She currently uses insulin to manage her blood sugars. Her hemoglobin A1c was 7 when last checked. She had gestational diabetes with her first pregnancy. She says that she was checked for diabetes between pregnancies and was always told that her tests were normal. She has had antenatal testing that confirms fetal well-being. Her blood pressure in the office today was 148/90. This was after an active day. Her mother says that her blood pressures have been elevated at home using the automated blood pressure cuff.  See history below.  OB History   Grav Para Term Preterm Abortions TAB SAB Ect Mult Living   4 1 1  1     1       Past Medical History  Diagnosis Date  . Depression     Prescriptions prior to admission  Medication Sig Dispense Refill  . diphenhydrAMINE (SOMINEX) 25 MG tablet Take 25 mg by mouth at bedtime as needed for sleep.      . insulin aspart (NOVOLOG) 100 UNIT/ML injection Inject into the skin 3 (three) times daily before meals.      . insulin lispro (HUMALOG) 100 UNIT/ML injection Inject into the skin 3 (three) times daily before meals.       . flintstones complete (FLINTSTONES) 60 MG chewable tablet Chew 1 tablet by mouth daily.        Past Surgical History  Procedure Laterality Date  . Small intestine surgery    . Cesarean section      No Known Allergies  Family History: family history includes Diabetes in her father, maternal grandfather, maternal grandmother, and paternal grandfather; Hyperlipidemia in her maternal grandmother and mother; Hypertension in her father, maternal grandmother, mother, and paternal grandfather; Prostate cancer in her father, maternal grandfather, and paternal grandfather; Stroke in her father.  Social History:  reports that she has quit smoking. Her smoking use included Cigarettes. She smoked 0.00 packs per day. She has never used smokeless tobacco. She reports that she drinks about 4.8 ounces of alcohol per week. She reports that she does not use illicit drugs.  Review of systems: Normal pregnancy complaints.  Admission Physical Exam:    Body mass index is 36.99 kg/(m^2).  Height 5\' 9"  (1.753 m), weight 250 lb 9.6 oz (113.671 kg), last menstrual period 07/27/2012, unknown if currently breastfeeding.  HEENT:                 Within normal limits Chest:                   Clear Heart:                    Regular rate and rhythm Abdomen:  Gravid and nontender Extremities:          Grossly normal Neurologic exam: Grossly normal  Prenatal labs: ABO, Rh:              O+ HBsAg:                   NR HIV:                         NR GBS:                      Neg Antibody:               Neg Rubella:                 Immune RPR:                      NR  Fetal heart rate: Category 1     Prenatal Transfer Tool  Maternal Diabetes: Yes:  Diabetes Type:  Insulin/Medication controlled Genetic Screening: Normal Maternal Ultrasounds/Referrals: Breech, fibroids Fetal Ultrasounds or other Referrals:  None Maternal Substance Abuse:  No Significant Maternal Medications:  Meds include:  Other: Insulin Significant Maternal Lab Results:  See HPI and results Other Comments:  TDAP given  Assessment:  [redacted]w[redacted]d gestation  Proteinuria  Prior cesarean section  Bowel obstruction after cesarean section requiring surgery  Advanced maternal age  Anemia  Breech presentation  Gestational Diabetes requiring insulin  Obesity  Depression  Rubella nonimmune  Desires sterilization.   Plan:  We will check [place] labs including a protein creatinine ratio. If the patient needs the criteria for preeclampsia, then we will proceed with cesarean delivery and tubal sterilization by way of salpingectomy tonight. If the patient does not meet the criteria, then we will plan to do her cesarean section as scheduled. We will continue antenatal surveillance.   Margeart Allender V 04/04/2013, 5:01 PM

## 2013-04-04 NOTE — MAU Note (Signed)
Patient states she was sent from the office for evaluation of elevated blood pressure and protein in the urine. States she is having mild cramping, no bleeding or leaking and reports good fetal movement.

## 2013-04-05 ENCOUNTER — Encounter (HOSPITAL_COMMUNITY): Payer: Self-pay | Admitting: *Deleted

## 2013-04-05 DIAGNOSIS — O1490 Unspecified pre-eclampsia, unspecified trimester: Secondary | ICD-10-CM | POA: Diagnosis present

## 2013-04-05 LAB — MAGNESIUM: Magnesium: 2.7 mg/dL — ABNORMAL HIGH (ref 1.5–2.5)

## 2013-04-05 LAB — CBC
HCT: 22.9 % — ABNORMAL LOW (ref 36.0–46.0)
HEMOGLOBIN: 7 g/dL — AB (ref 12.0–15.0)
MCH: 18.9 pg — ABNORMAL LOW (ref 26.0–34.0)
MCHC: 30.6 g/dL (ref 30.0–36.0)
MCV: 61.9 fL — ABNORMAL LOW (ref 78.0–100.0)
Platelets: 421 10*3/uL — ABNORMAL HIGH (ref 150–400)
RBC: 3.7 MIL/uL — AB (ref 3.87–5.11)
RDW: 19.4 % — ABNORMAL HIGH (ref 11.5–15.5)
WBC: 13.1 10*3/uL — AB (ref 4.0–10.5)

## 2013-04-05 LAB — GLUCOSE, CAPILLARY
GLUCOSE-CAPILLARY: 94 mg/dL (ref 70–99)
GLUCOSE-CAPILLARY: 95 mg/dL (ref 70–99)
GLUCOSE-CAPILLARY: 121 mg/dL — AB (ref 70–99)
Glucose-Capillary: 79 mg/dL (ref 70–99)
Glucose-Capillary: 86 mg/dL (ref 70–99)
Glucose-Capillary: 134 mg/dL — ABNORMAL HIGH (ref 70–99)

## 2013-04-05 LAB — COMPREHENSIVE METABOLIC PANEL
ALBUMIN: 2.2 g/dL — AB (ref 3.5–5.2)
ALK PHOS: 81 U/L (ref 39–117)
ALT: 8 U/L (ref 0–35)
AST: 20 U/L (ref 0–37)
BUN: 7 mg/dL (ref 6–23)
CALCIUM: 8.5 mg/dL (ref 8.4–10.5)
CO2: 23 mEq/L (ref 19–32)
Chloride: 100 mEq/L (ref 96–112)
Creatinine, Ser: 0.64 mg/dL (ref 0.50–1.10)
GFR calc Af Amer: >90 mL/min (ref >90)
GFR calc non Af Amer: >90 mL/min (ref >90)
Glucose, Bld: 95 mg/dL (ref 70–99)
Potassium: 4 mEq/L (ref 3.7–5.3)
SODIUM: 133 meq/L — AB (ref 137–147)
TOTAL PROTEIN: 5.2 g/dL — AB (ref 6.0–8.3)
Total Bilirubin: 0.6 mg/dL (ref 0.3–1.2)

## 2013-04-05 MED ORDER — NALBUPHINE HCL 10 MG/ML IJ SOLN
5.0000 mg | INTRAMUSCULAR | Status: DC | PRN
  Administered 2013-04-05: 5 mg via INTRAVENOUS
  Filled 2013-04-05 (×2): qty 1

## 2013-04-05 MED ORDER — SIMETHICONE 80 MG PO CHEW
80.0000 mg | CHEWABLE_TABLET | ORAL | Status: DC | PRN
  Filled 2013-04-05: qty 1

## 2013-04-05 MED ORDER — MEASLES, MUMPS & RUBELLA VAC ~~LOC~~ INJ
0.5000 mL | INJECTION | Freq: Once | SUBCUTANEOUS | Status: DC
  Filled 2013-04-05: qty 0.5

## 2013-04-05 MED ORDER — ONDANSETRON HCL 4 MG/2ML IJ SOLN: 4.0000 mg | INTRAMUSCULAR | Status: DC | PRN

## 2013-04-05 MED ORDER — SENNOSIDES-DOCUSATE SODIUM 8.6-50 MG PO TABS
2.0000 | ORAL_TABLET | ORAL | Status: DC
  Administered 2013-04-05 – 2013-04-07 (×3): 2 via ORAL
  Filled 2013-04-05 (×2): qty 2

## 2013-04-05 MED ORDER — NALBUPHINE HCL 10 MG/ML IJ SOLN
5.0000 mg | INTRAMUSCULAR | Status: DC | PRN
  Filled 2013-04-05: qty 1

## 2013-04-05 MED ORDER — PRENATAL MULTIVITAMIN CH
1.0000 | ORAL_TABLET | Freq: Every day | ORAL | Status: DC
  Administered 2013-04-05 – 2013-04-08 (×4): 1 via ORAL
  Filled 2013-04-05 (×4): qty 1

## 2013-04-05 MED ORDER — OXYTOCIN 40 UNITS IN LACTATED RINGERS INFUSION - SIMPLE MED
62.5000 mL/h | INTRAVENOUS | Status: AC
  Administered 2013-04-05: 62.5 mL/h via INTRAVENOUS

## 2013-04-05 MED ORDER — METOCLOPRAMIDE HCL 5 MG/ML IJ SOLN: 10.0000 mg | Freq: Three times a day (TID) | INTRAMUSCULAR | Status: DC | PRN

## 2013-04-05 MED ORDER — DIPHENHYDRAMINE HCL 25 MG PO CAPS: 25.0000 mg | ORAL_CAPSULE | Freq: Four times a day (QID) | ORAL | Status: DC | PRN

## 2013-04-05 MED ORDER — WITCH HAZEL-GLYCERIN EX PADS: 1.0000 "application " | MEDICATED_PAD | CUTANEOUS | Status: DC | PRN

## 2013-04-05 MED ORDER — IBUPROFEN 600 MG PO TABS
600.0000 mg | ORAL_TABLET | Freq: Four times a day (QID) | ORAL | Status: DC
  Administered 2013-04-05 – 2013-04-08 (×13): 600 mg via ORAL
  Filled 2013-04-05 (×13): qty 1

## 2013-04-05 MED ORDER — ONDANSETRON HCL 4 MG/2ML IJ SOLN: 4.0000 mg | Freq: Three times a day (TID) | INTRAMUSCULAR | Status: DC | PRN

## 2013-04-05 MED ORDER — NALOXONE HCL 1 MG/ML IJ SOLN
1.0000 ug/kg/h | INTRAVENOUS | Status: DC | PRN
  Filled 2013-04-05: qty 2

## 2013-04-05 MED ORDER — ONDANSETRON HCL 4 MG PO TABS: 4.0000 mg | ORAL_TABLET | ORAL | Status: DC | PRN

## 2013-04-05 MED ORDER — DIPHENHYDRAMINE HCL 25 MG PO CAPS
25.0000 mg | ORAL_CAPSULE | ORAL | Status: DC | PRN
  Administered 2013-04-05: 25 mg via ORAL
  Filled 2013-04-05: qty 1

## 2013-04-05 MED ORDER — SIMETHICONE 80 MG PO CHEW
80.0000 mg | CHEWABLE_TABLET | ORAL | Status: DC
  Administered 2013-04-05 – 2013-04-07 (×2): 80 mg via ORAL
  Filled 2013-04-05 (×2): qty 1

## 2013-04-05 MED ORDER — TETANUS-DIPHTH-ACELL PERTUSSIS 5-2.5-18.5 LF-MCG/0.5 IM SUSP
0.5000 mL | Freq: Once | INTRAMUSCULAR | Status: DC
  Filled 2013-04-05: qty 0.5

## 2013-04-05 MED ORDER — LANOLIN HYDROUS EX OINT: 1.0000 "application " | TOPICAL_OINTMENT | CUTANEOUS | Status: DC | PRN

## 2013-04-05 MED ORDER — NALOXONE HCL 0.4 MG/ML IJ SOLN: 0.4000 mg | INTRAMUSCULAR | Status: DC | PRN

## 2013-04-05 MED ORDER — LACTATED RINGERS IV SOLN: INTRAVENOUS | Status: DC

## 2013-04-05 MED ORDER — ZOLPIDEM TARTRATE 5 MG PO TABS: 5.0000 mg | ORAL_TABLET | Freq: Every evening | ORAL | Status: DC | PRN

## 2013-04-05 MED ORDER — SODIUM CHLORIDE 0.9 % IJ SOLN: 3.0000 mL | INTRAMUSCULAR | Status: DC | PRN

## 2013-04-05 MED ORDER — INSULIN NPH (HUMAN) (ISOPHANE) 100 UNIT/ML ~~LOC~~ SUSP
10.0000 [IU] | Freq: Every day | SUBCUTANEOUS | Status: DC
  Administered 2013-04-05: 10 [IU] via SUBCUTANEOUS
  Filled 2013-04-05: qty 10

## 2013-04-05 MED ORDER — MEDROXYPROGESTERONE ACETATE 150 MG/ML IM SUSP: 150.0000 mg | INTRAMUSCULAR | Status: DC | PRN

## 2013-04-05 MED ORDER — INSULIN NPH (HUMAN) (ISOPHANE) 100 UNIT/ML ~~LOC~~ SUSP
5.0000 [IU] | Freq: Every day | SUBCUTANEOUS | Status: DC
  Administered 2013-04-05: 5 [IU] via SUBCUTANEOUS
  Filled 2013-04-05: qty 10

## 2013-04-05 MED ORDER — SIMETHICONE 80 MG PO CHEW
80.0000 mg | CHEWABLE_TABLET | Freq: Three times a day (TID) | ORAL | Status: DC
  Administered 2013-04-05 – 2013-04-08 (×9): 80 mg via ORAL
  Filled 2013-04-05 (×8): qty 1

## 2013-04-05 MED ORDER — DIPHENHYDRAMINE HCL 50 MG/ML IJ SOLN: 25.0000 mg | INTRAMUSCULAR | Status: DC | PRN

## 2013-04-05 MED ORDER — OXYCODONE-ACETAMINOPHEN 5-325 MG PO TABS
1.0000 | ORAL_TABLET | ORAL | Status: DC | PRN
  Administered 2013-04-05 – 2013-04-08 (×10): 1 via ORAL
  Filled 2013-04-05 (×5): qty 1
  Filled 2013-04-05: qty 2
  Filled 2013-04-05 (×6): qty 1

## 2013-04-05 MED ORDER — HYDROMORPHONE HCL PF 1 MG/ML IJ SOLN
INTRAMUSCULAR | Status: AC
  Administered 2013-04-05: 0.5 mg via INTRAVENOUS
  Filled 2013-04-05: qty 1

## 2013-04-05 MED ORDER — DIPHENHYDRAMINE HCL 50 MG/ML IJ SOLN
12.5000 mg | INTRAMUSCULAR | Status: DC | PRN
  Administered 2013-04-05: 12.5 mg via INTRAVENOUS

## 2013-04-05 MED ORDER — MENTHOL 3 MG MT LOZG: 1.0000 | LOZENGE | OROMUCOSAL | Status: DC | PRN

## 2013-04-05 MED ORDER — DIBUCAINE 1 % RE OINT: 1.0000 "application " | TOPICAL_OINTMENT | RECTAL | Status: DC | PRN

## 2013-04-05 NOTE — Progress Notes (Signed)
UR chart review completed.  

## 2013-04-05 NOTE — Anesthesia Postprocedure Evaluation (Signed)
  Anesthesia Post-op Note  Patient: Pamela Gray  Procedure(s) Performed: Procedure(s): CESAREAN SECTION (N/A) BILATERAL SALPINGECTOMY  Patient is awake, responsive, moving her legs, and has signs of resolution of her numbness. Pain and nausea are reasonably well controlled. Vital signs are stable and clinically acceptable. Oxygen saturation is clinically acceptable. There are no apparent anesthetic complications at this time. Patient is ready for discharge.

## 2013-04-05 NOTE — Progress Notes (Signed)
Subjective: Postpartum Day 1: Cesarean Delivery Patient reports incisional pain and tolerating PO.    Objective: Vital signs in last 24 hours: Temp:  [97.4 F (36.3 C)-98.4 F (36.9 C)] 97.4 F (36.3 C) (02/11 1202) Pulse Rate:  [65-95] 82 (02/11 1206) Resp:  [16-42] 18 (02/11 1206) BP: (93-143)/(57-99) 117/84 mmHg (02/11 1500) SpO2:  [97 %-100 %] 98 % (02/11 1206) Weight:  [241 lb 14.4 oz (109.725 kg)-250 lb 9.6 oz (113.671 kg)] 241 lb 14.4 oz (109.725 kg) (02/11 0200)  Physical Exam:  General: alert and cooperative Lochia: appropriate Uterine Fundus: firm Incision: healing well, no significant drainage DVT Evaluation: No evidence of DVT seen on physical exam. Physical Examination: Chest - clear to auscultation, no wheezes, rales or rhonchi, symmetric air entry Heart - normal rate and regular rhythm    Recent Labs  04/04/13 1712 04/05/13 0526  HGB 7.8* 7.0*  HCT 25.0* 22.9*    Assessment/Plan: Status post Cesarean section. Postoperative course complicated by preeclampsia.  bps are stable with adequate urine output.  continue current care  Continue current care.  Catron A 04/05/2013, 3:28 PM

## 2013-04-05 NOTE — Progress Notes (Signed)
Called by [facility] regarding CBG and insulin orders. Due 10 u NPH insulin per order, but CBG 82---will hold this dose. Will do CBGs at 6am and 2 hour pp. Maintain mainline IV fluid as D51/2NS at 125 cc.  Donnel Saxon, CNM 04/05/13 3:15am

## 2013-04-05 NOTE — Anesthesia Postprocedure Evaluation (Signed)
  Anesthesia Post-op Note  Patient: Pamela Gray  Procedure(s) Performed: Procedure(s): CESAREAN SECTION (N/A) BILATERAL SALPINGECTOMY  Patient Location: [facility] and Nursing Unit  Anesthesia Type:Spinal  Level of Consciousness: awake  Airway and Oxygen Therapy: Patient Spontanous Breathing  Post-op Pain: none  Post-op Assessment: Patient's Cardiovascular Status Stable, Respiratory Function Stable, Patent Airway, No signs of Nausea or vomiting, Adequate PO intake, Pain level controlled, No headache, No backache, No residual numbness and No residual motor weakness  Post-op Vital Signs: Reviewed and stable  Complications: No apparent anesthesia complications

## 2013-04-05 NOTE — Addendum Note (Signed)
Encounter addended by: Clarene Duke, RN on: 04/05/2013  2:40 PM<BR>     Documentation filed: Charges VN

## 2013-04-05 NOTE — Addendum Note (Signed)
Addendum created 04/05/13 1038 by Tobin Chad, CRNA   Modules edited: Notes Section   Notes Section:  File: [id number]

## 2013-04-06 ENCOUNTER — Encounter (HOSPITAL_COMMUNITY): Payer: Self-pay | Admitting: Obstetrics and Gynecology

## 2013-04-06 LAB — GLUCOSE, CAPILLARY
Glucose-Capillary: 69 mg/dL — ABNORMAL LOW (ref 70–99)
Glucose-Capillary: 82 mg/dL (ref 70–99)
Glucose-Capillary: 107 mg/dL — ABNORMAL HIGH (ref 70–99)
Glucose-Capillary: 93 mg/dL (ref 70–99)

## 2013-04-06 NOTE — Discharge Summary (Signed)
Cesarean Section Delivery Discharge Summary  [facility]  DOB:    [birth year] MRN:    [id number] CSN:    [id number]  Date of admission:                  04/04/13  Date of discharge:                   04/08/13  Procedures this admission:  Repeat LTCS, bilateral tubal sterilization, pp magnesium  Date of Delivery: 04/04/13  Newborn Data:  Live born female  Birth Weight: 8 lb 6 oz (3799 g) APGAR: 9, 9  Currently in NICU, anticipating upcoming d/c Name: Pamela Gray  History of Present Illness:  Pamela Gray is a 43 y.o. female, [id number], who presents at [redacted]w[redacted]d weeks gestation. The patient has been followed at the [facility] and Gynecology division of [facility].    Her pregnancy has been complicated by:  Patient Active Problem List   Diagnosis Date Noted  . Status post repeat low transverse cesarean section 04/07/2013  . Preeclampsia 04/05/2013  . Dense breasts 04/06/2012  . Breast calcification, left 04/06/2012  . 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 Heavy menstrual bleeding 03/24/2012  Hx bowel obstruction s/p previous C/S.  Hospital course:  The patient was admitted for evaluation of BP, with pre-existing proteinuria.  Friendly labs were done in MAU, showing PCR 1.18.  The decision was made to proceed with repeat C/S and BTL by Dr. Raphael Gibney.  Patient tolerated the procedure without complicatioin.  She was placed on magnesium sulfate for 24 hours after delivery.  Her diabetes was managed with decreasing her insulin, and doses were decreased again on day 2, due to low CBGs.  Her post-partum course was not complicated after completion of the magnesium sulfate, other than by anemia without hemodynamic compromise, and she declined transfusion.  She was discharged to home on postpartum day 4 doing well.  Baby was in the NICU, with anticipated d/c on 2/14 or soon after.  Feeding:  breast  Contraception:  bilateral tubal  ligation  Discharge hemoglobin:  Hemoglobin  Date Value Ref Range Status  04/05/2013 7.0* 12.0 - 15.0 g/dL Final     HCT  Date Value Ref Range Status  04/05/2013 22.9* 36.0 - 46.0 % Final    Discharge Physical Exam:   General: alert Lochia: appropriate Uterine Fundus: firm Incision: Honeycomb dressing CDI DVT Evaluation: No evidence of DVT seen on physical exam. Negative Homan's sign.  Intrapartum Procedures: cesarean: low cervical, transverse Postpartum Procedures: PP magesium Complications-Operative and Postpartum: none  Discharge Diagnoses: Term Pregnancy-delivered, Preelampsia and anemia without hemodynamic compromise.  Discharge Information:  Activity:           Per [facility] [place] Diet:                routine Medications: Ibuprofen and Percocet, Fe Condition:      stable Instructions:  Care After Cesarean Delivery  Refer to this sheet in the next few weeks. These instructions provide you with information on caring for yourself after your procedure. Your caregiver may also give you specific instructions. Your treatment has been planned according to current medical practices, but problems sometimes occur. Call your caregiver if you have any problems or questions after you go home. HOME CARE INSTRUCTIONS  Only take over-the-counter or prescription medicines as directed by your caregiver.  Do not drink alcohol, especially if you are breastfeeding or taking medicine to relieve pain.  Do  not chew or smoke tobacco.  Continue to use good perineal care. Good perineal care includes:  Wiping your perineum from front to back.  Keeping your perineum clean.  Check your cut (incision) daily for increased redness, drainage, swelling, or separation of skin.  Clean your incision gently with soap and water every day, and then pat it dry. If your caregiver says it is okay, leave the incision uncovered. Use a bandage (dressing) if the incision is draining fluid or appears  irritated. If the adhesive strips across the incision do not fall off within 7 days, carefully peel them off.  Hug a pillow when coughing or sneezing until your incision is healed. This helps to relieve pain.  Do not use tampons or douche until your caregiver says it is okay.  Shower, wash your hair, and take tub baths as directed by your caregiver.  Wear a well-fitting bra that provides breast support.  Limit wearing support panties or control-top hose.  Drink enough fluids to keep your urine clear or pale yellow.  Eat high-fiber foods such as whole grain cereals and breads, brown rice, beans, and fresh fruits and vegetables every day. These foods may help prevent or relieve constipation.  Resume activities such as climbing stairs, driving, lifting, exercising, or traveling as directed by your caregiver.  Talk to your caregiver about resuming sexual activities. This is dependent upon your risk of infection, your rate of healing, and your comfort and desire to resume sexual activity.  Try to have someone help you with your household activities and your newborn for at least a few days after you leave the hospital.  Rest as much as possible. Try to rest or take a nap when your newborn is sleeping.  Increase your activities gradually.  Keep all of your scheduled postpartum appointments. It is very important to keep your scheduled follow-up appointments. At these appointments, your caregiver will be checking to make sure that you are healing physically and emotionally. SEEK MEDICAL CARE IF:   You are passing large clots from your vagina. Save any clots to show your caregiver.  You have a foul smelling discharge from your vagina.  You have trouble urinating.  You are urinating frequently.  You have pain when you urinate.  You have a change in your bowel movements.  You have increasing redness, pain, or swelling near your incision.  You have pus draining from your  incision.  Your incision is separating.  You have painful, hard, or reddened breasts.  You have a severe headache.  You have blurred vision or see spots.  You feel sad or depressed.  You have thoughts of hurting yourself or your newborn.  You have questions about your care, the care of your newborn, or medicines.  You are dizzy or lightheaded.  You have a rash.  You have pain, redness, or swelling at the site of the removed intravenous access (IV) tube.  You have nausea or vomiting.  You stopped breastfeeding and have not had a menstrual period within 12 weeks of stopping.  You are not breastfeeding and have not had a menstrual period within 12 weeks of delivery.  You have a fever. SEEK IMMEDIATE MEDICAL CARE IF:  You have persistent pain.  You have chest pain.  You have shortness of breath.  You faint.  You have leg pain.  You have stomach pain.  Your vaginal bleeding saturates 2 or more sanitary pads in 1 hour. MAKE SURE YOU:   Understand these instructions.  Will watch your condition.  Will get help right away if you are not doing well or get worse. Document Released: 11/01/2001 Document Revised: 11/04/2011 Document Reviewed: 10/07/2011 [facility] Patient Information 2014 [place].   Postpartum Depression and Baby Blues  The postpartum period begins right after the birth of a baby. During this time, there is often a great amount of joy and excitement. It is also a time of considerable changes in the life of the parent(s). Regardless of how many times a mother gives birth, each child brings new challenges and dynamics to the family. It is not unusual to have feelings of excitement accompanied by confusing shifts in moods, emotions, and thoughts. All mothers are at risk of developing postpartum depression or the "baby blues." These mood changes can occur right after giving birth, or they may occur many months after giving birth. The baby blues or  postpartum depression can be mild or severe. Additionally, postpartum depression can resolve rather quickly, or it can be a long-term condition. CAUSES Elevated hormones and their rapid decline are thought to be a main cause of postpartum depression and the baby blues. There are a number of hormones that radically change during and after pregnancy. Estrogen and progesterone usually decrease immediately after delivering your baby. The level of thyroid hormone and various cortisol steroids also rapidly drop. Other factors that play a major role in these changes include major life events and genetics.  RISK FACTORS If you have any of the following risks for the baby blues or postpartum depression, know what symptoms to watch out for during the postpartum period. Risk factors that may increase the likelihood of getting the baby blues or postpartum depression include:  Havinga personal or family history of depression.  Having depression while being pregnant.  Having premenstrual or oral contraceptive-associated mood issues.  Having exceptional life stress.  Having marital conflict.  Lacking a social support network.  Having a baby with special needs.  Having health problems such as diabetes. SYMPTOMS Baby blues symptoms include:  Brief fluctuations in mood, such as going from extreme happiness to sadness.  Decreased concentration.  Difficulty sleeping.  Crying spells, tearfulness.  Irritability.  Anxiety. Postpartum depression symptoms typically begin within the first month after giving birth. These symptoms include:  Difficulty sleeping or excessive sleepiness.  Marked weight loss.  Agitation.  Feelings of worthlessness.  Lack of interest in activity or food. Postpartum psychosis is a very concerning condition and can be dangerous. Fortunately, it is rare. Displaying any of the following symptoms is cause for immediate medical attention. Postpartum psychosis symptoms  include:  Hallucinations and delusions.  Bizarre or disorganized behavior.  Confusion or disorientation. DIAGNOSIS  A diagnosis is made by an evaluation of your symptoms. There are no medical or lab tests that lead to a diagnosis, but there are various questionnaires that a caregiver may use to identify those with the baby blues, postpartum depression, or psychosis. Often times, a screening tool called the [place] Postnatal Depression Scale is used to diagnose depression in the postpartum period.  TREATMENT The baby blues usually goes away on its own in 1 to 2 weeks. Social support is often all that is needed. You should be encouraged to get adequate sleep and rest. Occasionally, you may be given medicines to help you sleep.  Postpartum depression requires treatment as it can last several months or longer if it is not treated. Treatment may include individual or group therapy, medicine, or both to address any social, physiological,  and psychological factors that may play a role in the depression. Regular exercise, a healthy diet, rest, and social support may also be strongly recommended.  Postpartum psychosis is more serious and needs treatment right away. Hospitalization is often needed. HOME CARE INSTRUCTIONS  Get as much rest as you can. Nap when the baby sleeps.  Exercise regularly. Some women find yoga and walking to be beneficial.  Eat a balanced and nourishing diet.  Do little things that you enjoy. Have a cup of tea, take a bubble bath, read your favorite magazine, or listen to your favorite music.  Avoid alcohol.  Ask for help with household chores, cooking, grocery shopping, or running errands as needed. Do not try to do everything.  Talk to people close to you about how you are feeling. Get support from your partner, family members, friends, or other new moms.  Try to stay positive in how you think. Think about the things you are grateful for.  Do not spend a lot of time  alone.  Only take medicine as directed by your caregiver.  Keep all your postpartum appointments.  Let your caregiver know if you have any concerns. SEEK MEDICAL CARE IF: You are having a reaction or problems with your medicine. SEEK IMMEDIATE MEDICAL CARE IF:  You have suicidal feelings.  You feel you may harm the baby or someone else. Document Released: 11/14/2003 Document Revised: 05/04/2011 Document Reviewed: 12/16/2010 [facility] Patient Information 2014 [place], [place].  Discharge to: home     Donnel Saxon 04/07/2013

## 2013-04-06 NOTE — Progress Notes (Signed)
0715 - pt. returned from NICU and went straight to rm# [phone]. Pt. Without c/o or changes at this time, report will be given to her nurse in [place]

## 2013-04-06 NOTE — Lactation Note (Signed)
This note was copied from the chart of [place]. Lactation Consultation Note      Initial consult with this mom of a NICU baby, now 11 hours post partum, and 60 2/7 corrected gestation.  The baby is an IDM with hypoglycemia . MOm had been pumping, and getting small amounts of colostrum. With hadn expression, she was able to collect 2 mls. Mom very eager to brest feed and provide EBm for her baby. She has been to the NICU twice already. I will follow this family in the NICU. Mom is going to order a DEP from her insurance, and may had to rent a pump on her discharge to home.  Patient Name: Pamela Gray Today's Date: 04/06/2013     Maternal Data    Feeding    LATCH Score/Interventions                      Lactation Tools Discussed/Used     Consult Status      Pamela Gray 04/06/2013, 10:21 AM

## 2013-04-06 NOTE — Lactation Note (Signed)
This note was copied from the chart of [place]. Lactation Consultation Note     Follow up consult with this mom of a term baby, now 35 hours post partum. I assisted mom with latching the baby in the NICU. Mom was used to cradle hold, but was willing and able to try cross cradle hold. She needed help with the first latch, but latched independently on the second breast. She just needed encouragement that she was doing things correctly. It has been 11 years since she breast fed her son. Mom was advised to keep pumping , despite breast feeding, while she is separated from her baby.  I will follow this family in the NICU.  Patient Name: [name] Today's Date: 04/06/2013     Maternal Data    Feeding    LATCH Score/Interventions                      Lactation Tools Discussed/Used     Consult Status      [name] 04/06/2013, 2:34 PM

## 2013-04-06 NOTE — Progress Notes (Signed)
This note also relates to the following rows which could not be included: CBG Lab Component - View only - Cannot attach notes to extension rows   grahm crackers and apple juice given to pt. Pt. Denies any S&S of low blood sugar

## 2013-04-06 NOTE — Progress Notes (Signed)
Subjective: Postpartum Day 2: Cesarean Delivery Patient reports incisional pain, tolerating PO, + flatus and no problems voiding.  Pt asking about insulin and dressing.  Objective: Vital signs in last 24 hours: Temp:  [97.7 F (36.5 C)-98.7 F (37.1 C)] 98.6 F (37 C) (02/12 0953) Pulse Rate:  [72-95] 89 (02/12 0953) Resp:  [16-20] 16 (02/12 0953) BP: (108-135)/(66-100) 135/78 mmHg (02/12 0953) SpO2:  [97 %-100 %] 100 % (02/12 0953) Weight:  [110.995 kg (244 lb 11.2 oz)] 110.995 kg (244 lb 11.2 oz) (02/12 0539)  Physical Exam:  General: alert and no distress Lochia: appropriate Uterine Fundus: firm Incision: pressure dressing c/d/i DVT Evaluation: No evidence of DVT seen on physical exam.   Recent Labs  04/04/13 1712 04/05/13 0526  HGB 7.8* 7.0*  HCT 25.0* 22.9*    Assessment/Plan: Status post Cesarean section. Doing well postoperatively.  Continue current care. BP under good control on no meds Pt has been hypoglycemic on the insulin.  Will d/c insulin and watch CBGs (4x/day) D/c pressure dressing after shower and redress with honeycomb dressing Baby in NICU for glucose control Pt may want to room in if d/c'd tomorrow  Pamela Gray Y 04/06/2013, 1:50 PM

## 2013-04-06 NOTE — Progress Notes (Signed)
Pt. Went to NICU via wheelchair with her husband

## 2013-04-07 DIAGNOSIS — Z98891 History of uterine scar from previous surgery: Secondary | ICD-10-CM

## 2013-04-07 LAB — GLUCOSE, CAPILLARY
GLUCOSE-CAPILLARY: 86 mg/dL (ref 70–99)
Glucose-Capillary: 99 mg/dL (ref 70–99)
Glucose-Capillary: 120 mg/dL — ABNORMAL HIGH (ref 70–99)

## 2013-04-07 NOTE — Progress Notes (Signed)
Subjective: Postpartum Day 3: Cesarean Delivery Patient reports tolerating PO and no problems voiding.    Objective: Vital signs in last 24 hours: Temp:  [97.9 F (36.6 C)-98.7 F (37.1 C)] 98 F (36.7 C) (02/13 0618) Pulse Rate:  [79-97] 91 (02/13 0618) Resp:  [16] 16 (02/13 0618) BP: (129-148)/(78-88) 139/88 mmHg (02/13 0618) SpO2:  [98 %-100 %] 100 % (02/13 0618) Weight:  [230 lb (104.327 kg)] 230 lb (104.327 kg) (02/13 0615)  Physical Exam:  General: alert and cooperative Lochia: appropriate Uterine Fundus: firm Incision: healing well, no significant drainage DVT Evaluation: No evidence of DVT seen on physical exam. CV RRR Lungs CTAB   Recent Labs  04/04/13 1712 04/05/13 0526  HGB 7.8* 7.0*  HCT 25.0* 22.9*    Assessment/Plan: Status post Cesarean section. Doing well postoperatively. blood sugars are stable.  pt still with no BM and monitorin BS.  plan for DC tomorrow  Continue current care.  Marion A 04/07/2013, 10:17 AM

## 2013-04-07 NOTE — Lactation Note (Signed)
This note was copied from the chart of [name]. Lactation Consultation Note   Follow up consult with this mom of a NICU baby, now 63 hours post partum, and 37 4/7 weeks corrected gestation. Mom is successfully breast feeding the baby in the nICU. Her milk transitioned in today, and she was full with knots of milk, after breast feeding. i advised mom to pump  And massage out the knots of milk. I got ice packs for mom to apply to her breasts, and she greatly appreciated them. Mom was very soft after pumping . With some knots of milk remaining. Mom advised to continue breast feeding on cue, and to pump to comfort after feeding.    Mom is going to order a DEP from her insurance company. She may get to room in with the baby tomorrow, if both are ready to do so. Mom may need to rent a DEP on her discharge, even if she and baby get to go home together. Mom may need to pump to maintain her milk supply, until the baby gets closer to [redacted] weeks gestation. Mom knows she can follow up with lactation after discharge of baby to home.   Patient Name: Pamela Gray Date: 04/07/2013 Reason for consult: Follow-up assessment;NICU baby   Maternal Data    Feeding Feeding Type: Formula Nipple Type: Regular Length of feed: 45 min  LATCH Score/Interventions Latch: Grasps breast easily, tongue down, lips flanged, rhythmical sucking.  Audible Swallowing: Spontaneous and intermittent  Type of Nipple: Everted at rest and after stimulation  Comfort (Breast/Nipple): Filling, red/small blisters or bruises, mild/mod discomfort  Problem noted: Filling Interventions (Filling): Massage;Frequent nursing;Double electric pump (ice)  Hold (Positioning): No assistance needed to correctly position infant at breast.  LATCH Score: 10  Lactation Tools Discussed/Used     Consult Status Consult Status: Follow-up Date: 04/08/13 Follow-up type: In-patient    [name] 04/07/2013, 2:42  PM

## 2013-04-08 ENCOUNTER — Encounter (HOSPITAL_COMMUNITY)
Admission: RE | Admit: 2013-04-08 | Discharge: 2013-04-08 | Disposition: A | Discharge status: Home / Self Care | Payer: BC Managed Care – PPO | Source: Ambulatory Visit | Attending: Obstetrics and Gynecology | Admitting: Obstetrics and Gynecology

## 2013-04-08 DIAGNOSIS — O24419 Gestational diabetes mellitus in pregnancy, unspecified control: Secondary | ICD-10-CM | POA: Diagnosis present

## 2013-04-08 DIAGNOSIS — O923 Agalactia: Secondary | ICD-10-CM | POA: Insufficient documentation

## 2013-04-08 LAB — TYPE AND SCREEN
ABO/RH(D): O POS
Antibody Screen: NEGATIVE
UNIT DIVISION: 0
Unit division: 0

## 2013-04-08 LAB — GLUCOSE, CAPILLARY: Glucose-Capillary: 83 mg/dL (ref 70–99)

## 2013-04-08 MED ORDER — IBUPROFEN 600 MG PO TABS: 600.0000 mg | ORAL_TABLET | Freq: Four times a day (QID) | ORAL | Status: DC | PRN

## 2013-04-08 MED ORDER — MEASLES, MUMPS & RUBELLA VAC ~~LOC~~ INJ
0.5000 mL | INJECTION | Freq: Once | SUBCUTANEOUS | Status: AC
  Administered 2013-04-08: 0.5 mL via SUBCUTANEOUS
  Filled 2013-04-08: qty 0.5

## 2013-04-08 MED ORDER — OXYCODONE-ACETAMINOPHEN 5-325 MG PO TABS: 1.0000 | ORAL_TABLET | ORAL | Status: DC | PRN

## 2013-04-08 NOTE — Progress Notes (Signed)
Discharge instructions provided to patient and significant other.  Activity, medications, incision care, follow up appointments, when to call the doctor and community resources discussed.  No questions at this time.  Patient left unit in stable condition with all personal belongings and prescriptions accompanied by staff.  Leighton Roach, RN--------------

## 2013-04-08 NOTE — Discharge Instructions (Signed)
Postpartum Care After Cesarean Delivery °After you deliver your newborn (postpartum period), the usual stay in the hospital is 24 72 hours. If there were problems with your labor or delivery, or if you have other medical problems, you might be in the hospital longer.  °While you are in the hospital, you will receive help and instructions on how to care for yourself and your newborn during the postpartum period.  °While you are in the hospital: °· It is normal for you to have pain or discomfort from the incision in your abdomen. Be sure to tell your nurses when you are having pain, where the pain is located, and what makes the pain worse. °· If you are breastfeeding, you may feel uncomfortable contractions of your uterus for a couple of weeks. This is normal. The contractions help your uterus get back to normal size. °· It is normal to have some bleeding after delivery. °· For the first 1 3 days after delivery, the flow is red and the amount may be similar to a period. °· It is common for the flow to start and stop. °· In the first few days, you may pass some small clots. Let your nurses know if you begin to pass large clots or your flow increases. °· Do not  flush blood clots down the toilet before having the nurse look at them. °· During the next 3 10 days after delivery, your flow should become more watery and pink or brown-tinged in color. °· Ten to fourteen days after delivery, your flow should be a small amount of yellowish-white discharge. °· The amount of your flow will decrease over the first few weeks after delivery. Your flow may stop in 6 8 weeks. Most women have had their flow stop by 12 weeks after delivery. °· You should change your sanitary pads frequently. °· Wash your hands thoroughly with soap and water for at least 20 seconds after changing pads, using the toilet, or before holding or feeding your newborn. °· Your intravenous (IV) tubing will be removed when you are drinking enough fluids. °· The  urine drainage tube (urinary catheter) that was inserted before delivery may be removed within 6 8 hours after delivery or when feeling returns to your legs. You should feel like you need to empty your bladder within the first 6 8 hours after the catheter has been removed. °· In case you become weak, lightheaded, or faint, call your nurse before you get out of bed for the first time and before you take a shower for the first time. °· Within the first few days after delivery, your breasts may begin to feel tender and full. This is called engorgement. Breast tenderness usually goes away within 48 72 hours after engorgement occurs. You may also notice milk leaking from your breasts. If you are not breastfeeding, do not stimulate your breasts. Breast stimulation can make your breasts produce more milk. °· Spending as much time as possible with your newborn is very important. During this time, you and your newborn can feel close and get to know each other. Having your newborn stay in your room (rooming in) will help to strengthen the bond with your newborn. It will give you time to get to know your newborn and become comfortable caring for your newborn. °· Your hormones change after delivery. Sometimes the hormone changes can temporarily cause you to feel sad or tearful. These feelings should not last more than a few days. If these feelings last longer   than that, you should talk to your caregiver. °· If desired, talk to your caregiver about methods of family planning or contraception. °· Talk to your caregiver about immunizations. Your caregiver may want you to have the following immunizations before leaving the hospital: °· Tetanus, diphtheria, and pertussis (Tdap) or tetanus and diphtheria (Td) immunization. It is very important that you and your family (including grandparents) or others caring for your newborn are up-to-date with the Tdap or Td immunizations. The Tdap or Td immunization can help protect your newborn  from getting ill. °· Rubella immunization. °· Varicella (chickenpox) immunization. °· Influenza immunization. You should receive this annual immunization if you did not receive the immunization during your pregnancy. °Document Released: 11/04/2011 Document Reviewed: 11/04/2011 °[employer]® Patient Information ©2014 [employer]. ° °

## 2013-04-09 NOTE — Progress Notes (Signed)
Clinical Social Work Department BRIEF PSYCHOSOCIAL ASSESSMENT July 25, 2013-Late Entry  Patient:  Pamela Gray, Pamela Gray     Account Number:  [id number]     Admit date:  04-17-2013  Clinical Social Worker:  Barbarann Ehlers  Date/Time:  12-14-13 01:00 PM  Referred by:    Date Referred:    Other Referral:   No referral-NICU admission   Interview type:  Family Other interview type:    PSYCHOSOCIAL DATA Living Status:  FAMILY Admitted from facility:   Level of care:   Primary support name:  Hilma Favors and Charlesetta Garibaldi Primary support relationship to patient:  PARENT Degree of support available:   Good support system    CURRENT CONCERNS Current Concerns  None Noted   Other Concerns:   PNR states hx of Depression.  MOB states hx of Depression was in college.    SOCIAL WORK ASSESSMENT / PLAN CSW met with parents in MOB's third floor room/317 to introduce myself and complete assessment due to NICU admission.  CSW informed parents of ongoing support services offered by NICU CSW and gave contact information. CSW discussed common emotions related to the NICU experience as well as signs and symptoms of PPD.  CSW asked MOB to contact CSW or her doctor if she has concerns about her emotions at any time.  CSW has no social concerns at this time.   Assessment/plan status:   Other assessment/ plan:   Information/referral to community resources:   No referral needs noted at this time.    PATIENT'S/FAMILY'S RESPONSE TO PLAN OF CARE: Parents were very pleasant and receptive to CSW intervention.  They report doing well and are hopeful that baby will not need to be in the NICU very long.  MOB admits to feeling sad about being separated from baby at this time, but states understanding of why baby needs NICU care. She reports no hx of PPD with first child and no concerns at this time, but commits to talking with CSW or her doctor if symptoms arise.  Parents report that their families are not local, but  that they have good supports.  They report having everything they need for baby at home and that they are feeling comfortable and informed about baby's care at this time.  Parents seemed very appreciative of CSW's visit and know they can call at any time if questions, concerns or needs arise while baby is in the NICU.

## 2013-04-12 ENCOUNTER — Ambulatory Visit: Payer: Self-pay

## 2013-04-12 NOTE — Lactation Note (Signed)
This note was copied from the chart of [place]. Lactation Consultation Note    Follow up consult with this mom of a NICU baby, now 17 2/7 weeks corrected gestation. Mom roomed in with baby last night, and exclusively breast fed. Mom wants to come in for a follow up outpatient consult. I told mom to get home and settled, and then call for an appointment at her convenience.    Patient Name: Pamela Gray Today's Date: 04/12/2013     Maternal Data    Feeding    LATCH Score/Interventions                      Lactation Tools Discussed/Used     Consult Status      [name] 04/12/2013, 2:49 PM

## 2013-04-17 ENCOUNTER — Encounter (HOSPITAL_COMMUNITY): Admission: RE | Payer: Self-pay | Source: Ambulatory Visit

## 2013-04-17 ENCOUNTER — Inpatient Hospital Stay (HOSPITAL_COMMUNITY)
Admission: RE | Admit: 2013-04-17 | Payer: BC Managed Care – PPO | Source: Ambulatory Visit | Admitting: Obstetrics and Gynecology

## 2013-04-17 SURGERY — Surgical Case
Anesthesia: Regional | Laterality: Bilateral

## 2013-05-08 ENCOUNTER — Encounter (HOSPITAL_COMMUNITY)
Admission: RE | Admit: 2013-05-08 | Discharge: 2013-05-08 | Disposition: A | Discharge status: Home / Self Care | Payer: BC Managed Care – PPO | Source: Ambulatory Visit | Attending: Obstetrics and Gynecology | Admitting: Obstetrics and Gynecology

## 2013-05-08 DIAGNOSIS — O923 Agalactia: Secondary | ICD-10-CM | POA: Insufficient documentation

## 2013-12-25 ENCOUNTER — Encounter (HOSPITAL_COMMUNITY): Payer: Self-pay | Admitting: Obstetrics and Gynecology

## 2015-01-04 ENCOUNTER — Ambulatory Visit (INDEPENDENT_AMBULATORY_CARE_PROVIDER_SITE_OTHER): Payer: BC Managed Care – PPO

## 2015-01-04 ENCOUNTER — Encounter: Payer: Self-pay | Admitting: Podiatry

## 2015-01-04 ENCOUNTER — Ambulatory Visit (INDEPENDENT_AMBULATORY_CARE_PROVIDER_SITE_OTHER): Payer: BC Managed Care – PPO | Admitting: Podiatry

## 2015-01-04 VITALS — BP 125/81 | HR 78 | Resp 16

## 2015-01-04 DIAGNOSIS — M722 Plantar fascial fibromatosis: Secondary | ICD-10-CM | POA: Diagnosis not present

## 2015-01-04 MED ORDER — MELOXICAM 15 MG PO TABS: 15.0000 mg | ORAL_TABLET | Freq: Every day | ORAL | Status: DC

## 2015-01-04 MED ORDER — TRIAMCINOLONE ACETONIDE 10 MG/ML IJ SUSP
10.0000 mg | Freq: Once | INTRAMUSCULAR | Status: AC
  Administered 2015-01-04: 10 mg

## 2015-01-04 NOTE — Progress Notes (Signed)
   Subjective:    Patient ID: Pamela Gray, female    DOB: 14-Mar-1970, 44 y.o.   MRN: [id number]  HPI Comments: "I think I have that fasciitis"  Patient presents with: Foot Pain: Plantar heel right - aching for about several months, AM pain, have been exercising a lot lately, tired stretching - not much help.    Foot Pain      Review of Systems  All other systems reviewed and are negative.      Objective:   Physical Exam        Assessment & Plan:

## 2015-01-04 NOTE — Patient Instructions (Signed)

## 2015-01-06 NOTE — Progress Notes (Signed)
Subjective:     Patient ID: Pamela Gray, female   DOB: Jun 09, 1970, 44 y.o.   MRN: [id number]  HPI patient presents stating I have a lot of pain in my right heel for the last few months and it's making it hard to walk or be active. Getting worse over that time   Review of Systems  All other systems reviewed and are negative.      Objective:   Physical Exam  Constitutional: She is oriented to person, place, and time.  Cardiovascular: Intact distal pulses.   Musculoskeletal: Normal range of motion.  Neurological: She is oriented to person, place, and time.  Skin: Skin is warm.  Nursing note and vitals reviewed.  neurovascular status intact muscle strength adequate range of motion within normal limits with patient noted to have exquisite discomfort right plantar heel insertional point tendon into the calcaneus with fluid buildup noted. Moderate depression of the arch she is found to have good digital perfusion and is well oriented 3     Assessment:     Plantar fasciitis right heel at the insertional point to the calcaneus    Plan:     H&P conditions reviewed and today injected the plantar fascia 3 mg Kenalog 5 mg Xylocaine and applied fascial brace. Gave instructions on physical therapy and shoe gear modifications and reappoint to recheck

## 2015-01-11 ENCOUNTER — Ambulatory Visit: Payer: BC Managed Care – PPO | Admitting: Podiatry

## 2015-01-14 ENCOUNTER — Ambulatory Visit (INDEPENDENT_AMBULATORY_CARE_PROVIDER_SITE_OTHER): Payer: BC Managed Care – PPO | Admitting: Podiatry

## 2015-01-14 ENCOUNTER — Encounter: Payer: Self-pay | Admitting: Podiatry

## 2015-01-14 VITALS — BP 118/80 | HR 72 | Resp 16

## 2015-01-14 DIAGNOSIS — M79671 Pain in right foot: Secondary | ICD-10-CM | POA: Diagnosis not present

## 2015-01-14 DIAGNOSIS — M722 Plantar fascial fibromatosis: Secondary | ICD-10-CM | POA: Diagnosis not present

## 2015-01-14 MED ORDER — TRIAMCINOLONE ACETONIDE 10 MG/ML IJ SUSP
10.0000 mg | Freq: Once | INTRAMUSCULAR | Status: AC
  Administered 2015-01-14: 10 mg

## 2015-01-14 NOTE — Progress Notes (Signed)
Subjective:     Patient ID: Pamela Gray, female   DOB: 24-Apr-1970, 44 y.o.   MRN: [id number]  HPI patient states my heel is still bother me quite a bit but improved from previous visit   Review of Systems     Objective:   Physical Exam Neurovascular status intact muscle strength adequate with continued discomfort in the right plantar fascia at the insertional point of the tendon into the calcaneus with depression of the arch noted    Assessment:     Plantar fasciitis with pain right    Plan:     Reinjected the plantar fascia right 3 Milligan Kenalog 5 mill grams Xylocaine and discussed long-term orthotics and scanned for customized orthotic devices of a Berkley type nature with deep heel seat which was tolerated well. Reappoint when ready or earlier if any issues should occur

## 2015-02-01 ENCOUNTER — Ambulatory Visit: Payer: BC Managed Care – PPO | Admitting: *Deleted

## 2015-02-01 DIAGNOSIS — M722 Plantar fascial fibromatosis: Secondary | ICD-10-CM

## 2015-02-01 NOTE — Progress Notes (Signed)
Patient ID: Pamela Gray, female   DOB: 1970/04/04, 44 y.o.   MRN: [id number] Patient presents for orthotic pick up.  Verbal and written break in and wear instructions given.  Patient will follow up in 4 weeks if symptoms worsen or fail to improve.

## 2015-02-01 NOTE — Patient Instructions (Signed)

## 2015-06-27 ENCOUNTER — Other Ambulatory Visit: Payer: Self-pay | Admitting: Obstetrics and Gynecology

## 2015-06-27 DIAGNOSIS — N6320 Unspecified lump in the left breast, unspecified quadrant: Principal | ICD-10-CM

## 2015-06-27 DIAGNOSIS — N6325 Unspecified lump in the left breast, overlapping quadrants: Secondary | ICD-10-CM

## 2015-07-02 ENCOUNTER — Ambulatory Visit
Admission: RE | Admit: 2015-07-02 | Discharge: 2015-07-02 | Disposition: A | Discharge status: Home / Self Care | Payer: BC Managed Care – PPO | Source: Ambulatory Visit | Attending: Obstetrics and Gynecology | Admitting: Obstetrics and Gynecology

## 2015-07-02 DIAGNOSIS — N6325 Unspecified lump in the left breast, overlapping quadrants: Secondary | ICD-10-CM

## 2015-07-02 DIAGNOSIS — N6320 Unspecified lump in the left breast, unspecified quadrant: Principal | ICD-10-CM

## 2016-06-30 ENCOUNTER — Other Ambulatory Visit: Payer: Self-pay | Admitting: Obstetrics and Gynecology

## 2016-06-30 DIAGNOSIS — N6320 Unspecified lump in the left breast, unspecified quadrant: Secondary | ICD-10-CM

## 2016-07-13 ENCOUNTER — Encounter: Payer: Self-pay | Admitting: Podiatry

## 2016-07-13 ENCOUNTER — Ambulatory Visit (INDEPENDENT_AMBULATORY_CARE_PROVIDER_SITE_OTHER): Payer: BC Managed Care – PPO

## 2016-07-13 ENCOUNTER — Ambulatory Visit (INDEPENDENT_AMBULATORY_CARE_PROVIDER_SITE_OTHER): Payer: BC Managed Care – PPO | Admitting: Podiatry

## 2016-07-13 DIAGNOSIS — D169 Benign neoplasm of bone and articular cartilage, unspecified: Secondary | ICD-10-CM | POA: Diagnosis not present

## 2016-07-13 DIAGNOSIS — M722 Plantar fascial fibromatosis: Secondary | ICD-10-CM

## 2016-07-13 MED ORDER — TRIAMCINOLONE ACETONIDE 10 MG/ML IJ SUSP
10.0000 mg | Freq: Once | INTRAMUSCULAR | Status: AC
  Administered 2016-07-13: 10 mg

## 2016-07-14 ENCOUNTER — Other Ambulatory Visit: Payer: Self-pay | Admitting: Obstetrics and Gynecology

## 2016-07-14 ENCOUNTER — Ambulatory Visit
Admission: RE | Admit: 2016-07-14 | Discharge: 2016-07-14 | Disposition: A | Discharge status: Home / Self Care | Payer: BC Managed Care – PPO | Source: Ambulatory Visit | Attending: Obstetrics and Gynecology | Admitting: Obstetrics and Gynecology

## 2016-07-14 DIAGNOSIS — N6320 Unspecified lump in the left breast, unspecified quadrant: Secondary | ICD-10-CM

## 2016-07-15 NOTE — Progress Notes (Signed)
Subjective:    Patient ID: Pamela Gray, female   DOB: 46 y.o.   MRN: [id number]   HPI patient presents stating that she's getting pain in the right plantar fascia with inflammation fluid buildup noted and also states that she's getting a spur formation of her right foot that she was concerned about    ROS      Objective:  Physical Exam Neurovascular status intact negative Homans sign was noted with significant diminishment of discomfort of the plantar fascia right with inflammation still noted and noted also to have spurring of the dorsal right over left localized in nature    Assessment:   Plantar fascial symptomatology right over left with dorsal spur formation      Plan:    H&P condition reviewed and I went ahead today injected the plantar fascia 3 mg Kenalog 5 mg Xylocaine advised on wider-type shoes and applied fascial brace. Reappoint 2 weeks or earlier if needed  X-ray indicates spur formation with no indication of stress fracture and mild dorsal spur formation

## 2016-07-29 ENCOUNTER — Ambulatory Visit: Payer: BC Managed Care – PPO | Admitting: Podiatry

## 2016-12-11 ENCOUNTER — Other Ambulatory Visit: Payer: Self-pay | Admitting: Obstetrics and Gynecology

## 2016-12-11 DIAGNOSIS — D242 Benign neoplasm of left breast: Secondary | ICD-10-CM

## 2016-12-22 ENCOUNTER — Ambulatory Visit
Admission: RE | Admit: 2016-12-22 | Discharge: 2016-12-22 | Disposition: A | Discharge status: Home / Self Care | Payer: BC Managed Care – PPO | Source: Ambulatory Visit | Attending: Obstetrics and Gynecology | Admitting: Obstetrics and Gynecology

## 2016-12-22 DIAGNOSIS — D242 Benign neoplasm of left breast: Secondary | ICD-10-CM

## 2017-03-03 ENCOUNTER — Other Ambulatory Visit: Payer: Self-pay

## 2017-03-03 ENCOUNTER — Ambulatory Visit: Payer: BC Managed Care – PPO | Admitting: Physician Assistant

## 2017-03-03 ENCOUNTER — Encounter: Payer: Self-pay | Admitting: Physician Assistant

## 2017-03-03 VITALS — BP 123/83 | HR 80 | Temp 98.0°F | Resp 16 | Ht 69.0 in | Wt 200.8 lb

## 2017-03-03 DIAGNOSIS — R631 Polydipsia: Secondary | ICD-10-CM

## 2017-03-03 DIAGNOSIS — R8290 Unspecified abnormal findings in urine: Secondary | ICD-10-CM | POA: Diagnosis not present

## 2017-03-03 DIAGNOSIS — R718 Other abnormality of red blood cells: Secondary | ICD-10-CM

## 2017-03-03 LAB — POCT URINALYSIS DIP (MANUAL ENTRY)
Bilirubin, UA: NEGATIVE
Blood, UA: NEGATIVE
Glucose, UA: NEGATIVE mg/dL
Ketones, POC UA: NEGATIVE mg/dL
Leukocytes, UA: NEGATIVE
Nitrite, UA: POSITIVE — AB
Protein Ur, POC: NEGATIVE mg/dL
Spec Grav, UA: >=1.03 — AB (ref 1.010–1.025)
Urobilinogen, UA: 1 E.U./dL
pH, UA: 6 (ref 5.0–8.0)

## 2017-03-03 LAB — POCT CBC
GRANULOCYTE PERCENT: 54.5 % (ref 37–80)
HCT, POC: 37.2 % — AB (ref 37.7–47.9)
Hemoglobin: 11.8 g/dL — AB (ref 12.2–16.2)
Lymph, poc: 3 (ref 0.6–3.4)
MCH, POC: 22.1 pg — AB (ref 27–31.2)
MCHC: 31.7 g/dL — AB (ref 31.8–35.4)
MCV: 69.7 fL — AB (ref 80–97)
MID (cbc): 0.4 (ref 0–0.9)
MPV: 9.3 fL (ref 0–99.8)
PLATELET COUNT, POC: 517 10*3/uL — AB (ref 142–424)
POC Granulocyte: 4 (ref 2–6.9)
POC LYMPH %: 40.2 % (ref 10–50)
POC MID %: 5.3 %M (ref 0–12)
RBC: 5.34 M/uL (ref 4.04–5.48)
RDW, POC: 17.4 %
WBC: 7.4 10*3/uL (ref 4.6–10.2)

## 2017-03-03 LAB — POCT GLYCOSYLATED HEMOGLOBIN (HGB A1C): Hemoglobin A1C: 5.7

## 2017-03-03 LAB — GLUCOSE, POCT (MANUAL RESULT ENTRY): POC GLUCOSE: 116 mg/dL — AB (ref 70–99)

## 2017-03-03 LAB — POCT URINE PREGNANCY: Preg Test, Ur: NEGATIVE

## 2017-03-03 NOTE — Patient Instructions (Signed)
     IF you received an x-ray today, you will receive an invoice from [facility]. Please contact [facility] at [phone] with questions or concerns regarding your invoice.   IF you received labwork today, you will receive an invoice from [facility]. Please contact [facility] at [phone] with questions or concerns regarding your invoice.   Our billing staff will not be able to assist you with questions regarding bills from these companies.  You will be contacted with the lab results as soon as they are available. The fastest way to get your results is to activate your My Chart account. Instructions are located on the last page of this paperwork. If you have not heard from us regarding the results in 2 weeks, please contact this office.     

## 2017-03-03 NOTE — Progress Notes (Signed)
03/05/2017 1:03 PM   DOB: 03/31/1970 / MRN: [id number]  SUBJECTIVE:  Pamela Gray is a 47 y.o. female presenting for multiple symptoms.   Having urinary frequency.  Denies dysuria, urgency, flank pain.   Began having a flu like illness about 4 days ago and became so thristy that she had to drink 5 bottles of water.  She had lost ten lbs 2/2 ketodiet and stopped this abruptly once she started feeling sick. She is worried she may have diabetes.  She associates cough, HA, fatigue and muscle aches. Does not complain of tic bites.   She has No Known Allergies.   She  has a past medical history of Depression.    She  reports that she has quit smoking. Her smoking use included cigarettes. she has never used smokeless tobacco. She reports that she drinks about 4.8 oz of alcohol per week. She reports that she does not use drugs. She  reports that she currently engages in sexual activity. She reports using the following method of birth control/protection: Condom. The patient  has a past surgical history that includes Small intestine surgery; Cesarean section; Cesarean section (N/A, 04/04/2013); and Bilateral salpingectomy (04/04/2013).  Her family history includes Diabetes in her father, maternal grandfather, maternal grandmother, and paternal grandfather; Hyperlipidemia in her maternal grandmother and mother; Hypertension in her father, maternal grandmother, mother, and paternal grandfather; Prostate cancer in her father, maternal grandfather, and paternal grandfather; Stroke in her father.  Review of Systems  Constitutional: Negative for chills, diaphoresis and fever.  Eyes: Negative.   Respiratory: Negative for cough, hemoptysis, sputum production, shortness of breath and wheezing.   Cardiovascular: Negative for chest pain, orthopnea and leg swelling.  Gastrointestinal: Negative for nausea.  Skin: Negative for rash.  Neurological: Negative for dizziness, sensory change, speech change, focal  weakness and headaches.    The problem list and medications were reviewed and updated by myself where necessary and exist elsewhere in the encounter.   OBJECTIVE:  BP 123/83 (BP Location: Right Arm, Patient Position: Sitting, Cuff Size: Normal)   Pulse 80   Temp 98 F (36.7 C) (Oral)   Resp 16   Ht 5\' 9"  (1.753 m)   Wt 200 lb 12.8 oz (91.1 kg)   SpO2 98%   BMI 29.65 kg/m   Physical Exam  Constitutional: She is active.  Non-toxic appearance.  Cardiovascular: Normal rate, regular rhythm, S1 normal, S2 normal, normal heart sounds and intact distal pulses. Exam reveals no gallop, no friction rub and no decreased pulses.  No murmur heard. Pulmonary/Chest: Effort normal. No stridor. No tachypnea. No respiratory distress. She has no wheezes. She has no rales.  Abdominal: She exhibits no distension.  Musculoskeletal: She exhibits no edema.  Neurological: She is alert.  Skin: Skin is warm and dry. She is not diaphoretic. No pallor.    Results for orders placed or performed in visit on 03/03/17 (from the past 72 hour(s))  POCT glucose (manual entry)     Status: Abnormal   Collection Time: 03/03/17  5:13 PM  Result Value Ref Range   POC Glucose 116 (A) 70 - 99 mg/dl  POCT CBC     Status: Abnormal   Collection Time: 03/03/17  5:14 PM  Result Value Ref Range   WBC 7.4 4.6 - 10.2 K/uL   Lymph, poc 3.0 0.6 - 3.4   POC LYMPH PERCENT 40.2 10 - 50 %L   MID (cbc) 0.4 0 - 0.9   POC MID % 5.3  0 - 12 %M   POC Granulocyte 4.0 2 - 6.9   Granulocyte percent 54.5 37 - 80 %G   RBC 5.34 4.04 - 5.48 M/uL   Hemoglobin 11.8 (A) 12.2 - 16.2 g/dL   HCT, POC 37.2 (A) 37.7 - 47.9 %   MCV 69.7 (A) 80 - 97 fL   MCH, POC 22.1 (A) 27 - 31.2 pg   MCHC 31.7 (A) 31.8 - 35.4 g/dL   RDW, POC 17.4 %   Platelet Count, POC 517 (A) 142 - 424 K/uL   MPV 9.3 0 - 99.8 fL  POCT urinalysis dipstick     Status: Abnormal   Collection Time: 03/03/17  5:20 PM  Result Value Ref Range   Color, UA yellow yellow    Clarity, UA cloudy (A) clear   Glucose, UA negative negative mg/dL   Bilirubin, UA negative negative   Ketones, POC UA negative negative mg/dL   Spec Grav, UA >=1.030 (A) 1.010 - 1.025   Blood, UA negative negative   pH, UA 6.0 5.0 - 8.0   Protein Ur, POC negative negative mg/dL   Urobilinogen, UA 1.0 0.2 or 1.0 E.U./dL   Nitrite, UA Positive (A) Negative   Leukocytes, UA Negative Negative  POCT glycosylated hemoglobin (Hb A1C)     Status: None   Collection Time: 03/03/17  5:21 PM  Result Value Ref Range   Hemoglobin A1C 5.7   POCT urine pregnancy     Status: None   Collection Time: 03/03/17  5:23 PM  Result Value Ref Range   Preg Test, Ur Negative Negative  Iron, TIBC and Ferritin Panel     Status: Abnormal   Collection Time: 03/03/17  5:30 PM  Result Value Ref Range   Total Iron Binding Capacity 455 (H) 250 - 450 ug/dL   UIBC 425 131 - 425 ug/dL   Iron 30 27 - 159 ug/dL   Iron Saturation 7 (LL) 15 - 55 %   Ferritin 7 (L) 15 - 150 ng/mL  TSH     Status: None   Collection Time: 03/03/17  5:30 PM  Result Value Ref Range   TSH 2.090 0.450 - 4.500 uIU/mL  Urine Culture     Status: Abnormal   Collection Time: 03/03/17  5:42 PM  Result Value Ref Range   Urine Culture, Routine Final report (A)    Organism ID, Bacteria Klebsiella pneumoniae (A)     Comment: Greater than 100,000 colony forming units per mL Cefazolin <=4 ug/mL Cefazolin with an MIC <=16 predicts susceptibility to the oral agents cefaclor, cefdinir, cefpodoxime, cefprozil, cefuroxime, cephalexin, and loracarbef when used for therapy of uncomplicated urinary tract infections due to E. coli, Klebsiella pneumoniae, and Proteus mirabilis.    Antimicrobial Susceptibility Comment     Comment:       ** S = Susceptible; I = Intermediate; R = Resistant **                    P = Positive; N = Negative             MICS are expressed in micrograms per mL    Antibiotic                 RSLT#1    RSLT#2    RSLT#3     RSLT#4 Amoxicillin/Clavulanic Acid    S Ampicillin  R Cefepime                       S Ceftriaxone                    S Cefuroxime                     S Ciprofloxacin                  S Ertapenem                      S Gentamicin                     S Imipenem                       S Levofloxacin                   S Meropenem                      S Nitrofurantoin                 S Piperacillin/Tazobactam        I Tetracycline                   S Tobramycin                     S Trimethoprim/Sulfa             S     No results found.  ASSESSMENT AND PLAN:  Josiane was seen today for illness.  Diagnoses and all orders for this visit:  Excessive thirst: No diabetes.  Possibly a UTI given culture but this seems unlikely given the degree of her symptoms and improvement without meds.  However, given culture it is worth trying a lower risk urinary anti-infective.   -     POCT glycosylated hemoglobin (Hb A1C) -     POCT CBC -     POCT glucose (manual entry) -     POCT urinalysis dipstick -     POCT urine pregnancy  Microcytosis -     Iron, TIBC and Ferritin Panel -     TSH  Urine abnormality -     Urine Culture -     nitrofurantoin, macrocrystal-monohydrate, (MACROBID) 100 MG capsule; Take 1 capsule (100 mg total) by mouth 2 (two) times daily for 5 days.    The patient is advised to call or return to clinic if she does not see an improvement in symptoms, or to seek the care of the closest emergency department if she worsens with the above plan.   Philis Fendt, MHS, PA-C [facility] 03/05/2017 1:03 PM

## 2017-03-04 ENCOUNTER — Other Ambulatory Visit: Payer: Self-pay | Admitting: Physician Assistant

## 2017-03-04 LAB — IRON,TIBC AND FERRITIN PANEL
Ferritin: 7 ng/mL — ABNORMAL LOW (ref 15–150)
IRON: 30 ug/dL (ref 27–159)
Iron Saturation: 7 % — CL (ref 15–55)
Total Iron Binding Capacity: 455 ug/dL — ABNORMAL HIGH (ref 250–450)
UIBC: 425 ug/dL (ref 131–425)

## 2017-03-04 LAB — TSH: TSH: 2.09 u[IU]/mL (ref 0.450–4.500)

## 2017-03-04 MED ORDER — FERROUS GLUCONATE 324 (38 FE) MG PO TABS: 324.0000 mg | ORAL_TABLET | Freq: Two times a day (BID) | ORAL | 0 refills | Status: DC

## 2017-03-05 LAB — URINE CULTURE

## 2017-03-05 MED ORDER — NITROFURANTOIN MONOHYD MACRO 100 MG PO CAPS: 100.0000 mg | ORAL_CAPSULE | Freq: Two times a day (BID) | ORAL | 0 refills | Status: AC

## 2017-03-05 NOTE — Progress Notes (Signed)
Starting nitrofurantoin per culture.  Patioent made aware and will begin PO therapy Philis Fendt, MS, PA-C 1:19 PM, 03/05/2017

## 2017-05-06 ENCOUNTER — Ambulatory Visit: Payer: BC Managed Care – PPO | Admitting: Physician Assistant

## 2017-05-06 ENCOUNTER — Encounter: Payer: Self-pay | Admitting: Physician Assistant

## 2017-05-06 ENCOUNTER — Other Ambulatory Visit: Payer: Self-pay

## 2017-05-06 ENCOUNTER — Ambulatory Visit (INDEPENDENT_AMBULATORY_CARE_PROVIDER_SITE_OTHER): Payer: BC Managed Care – PPO

## 2017-05-06 VITALS — BP 108/72 | HR 80 | Temp 98.0°F | Resp 16 | Ht 70.0 in | Wt 203.8 lb

## 2017-05-06 DIAGNOSIS — M545 Low back pain, unspecified: Secondary | ICD-10-CM

## 2017-05-06 DIAGNOSIS — S39012A Strain of muscle, fascia and tendon of lower back, initial encounter: Secondary | ICD-10-CM

## 2017-05-06 DIAGNOSIS — M542 Cervicalgia: Secondary | ICD-10-CM

## 2017-05-06 DIAGNOSIS — S161XXA Strain of muscle, fascia and tendon at neck level, initial encounter: Secondary | ICD-10-CM

## 2017-05-06 MED ORDER — CYCLOBENZAPRINE HCL 5 MG PO TABS: 5.0000 mg | ORAL_TABLET | Freq: Three times a day (TID) | ORAL | 0 refills | Status: DC | PRN

## 2017-05-06 MED ORDER — NAPROXEN 500 MG PO TABS: 500.0000 mg | ORAL_TABLET | Freq: Two times a day (BID) | ORAL | 0 refills | Status: DC
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
## 2017-05-06 NOTE — Patient Instructions (Addendum)
I recommend resting today. However, tomorrow I would begin walking and moving around as much as tolerated. Begin stretching in a couple of days. The worse thing you can do for neck and low back pain is lie in bed all day or sit down all day. Use medications as needed.   Just to know, flexeril can cause side effects that may impair your thinking or reactions. Be careful if you drive or do anything that requires you to be awake and alert. void drinking alcohol, which can increase some of the side effects of Flexeril.  NSAIDs like meloxicam have common side effects of heartburn, stomach pain, indigestion, and headache. Could lead to renal insufficiency, stroke, or GI bleed if taken excess amounts outside of what is recommended on label long term.    You should avoid heavy lifting or strenuous repetitive activity to prevent recurrence of event. Experiment with both ice and heat and choose whichever feels best for you.  Use heat pad or ice pack, do not apply directly to skin, use barrier such as towel over the skin. Leave on for 15-20 minutes, 3-4 times a day.  Please perform exercises below. Stretches are to be performed for 2 sets, holding 10-15 seconds each. Recommended to perform this rehab twice daily within pain tolerance for 2 weeks.   FLEXION RANGE OF MOTION AND STRETCHING EXERCISES: STRETCH - Flexion, Single Knee to Chest   Lie on a firm bed or floor with both legs extended in front of you.  Keeping one leg in contact with the floor, bring your opposite knee to your chest. Hold your leg in place by either grabbing behind your thigh or at your knee.  Pull until you feel a gentle stretch in your lower back.   Slowly release your grasp and repeat the exercise with the opposite side.  STRETCH - Flexion, Double Knee to Chest   Lie on a firm bed or floor with both legs extended in front of you.  Keeping one leg in contact with the floor, bring your opposite knee to your chest.  Tense your  stomach muscles to support your back and then lift your other knee to your chest. Hold your legs in place by either grabbing behind your thighs or at your knees.  Pull both knees toward your chest until you feel a gentle stretch in your lower back.   Tense your stomach muscles and slowly return one leg at a time to the floor.  STRETCH - Low Trunk Rotation  Lie on a firm bed or floor. Keeping your legs in front of you, bend your knees so they are both pointed toward the ceiling and your feet are flat on the floor.  Extend your arms out to the side. This will stabilize your upper body by keeping your shoulders in contact with the floor.  Gently and slowly drop both knees together to one side until you feel a gentle stretch in your lower back.   Tense your stomach muscles to support your lower back as you bring your knees back to the starting position. Repeat the exercise to the other side.   EXTENSION RANGE OF MOTION AND FLEXIBILITY EXERCISES: STRETCH - Extension, Prone on Elbows   Lie on your stomach on the floor, a bed will be too soft. Place your palms about shoulder width apart and at the height of your head.  Place your elbows under your shoulders. If this is too painful, stack pillows under your chest.  Allow your body  to relax so that your hips drop lower and make contact more completely with the floor.  Slowly return to lying flat on the floor.  RANGE OF MOTION - Extension, Prone Press Ups  Lie on your stomach on the floor, a bed will be too soft. Place your palms about shoulder width apart and at the height of your head.  Keeping your back as relaxed as possible, slowly straighten your elbows while keeping your hips on the floor. You may adjust the placement of your hands to maximize your comfort. As you gain motion, your hands will come more underneath your shoulders.  Slowly return to lying flat on the floor.  RANGE OF MOTION- Quadruped, Neutral Spine   Assume a hands  and knees position on a firm surface. Keep your hands under your shoulders and your knees under your hips. You may place padding under your knees for comfort.  Drop your head and point your tail bone toward the ground below you. This will round out your lower back like an angry cat.    Slowly lift your head and release your tail bone so that your back sags into a large arch, like an old horse.  Repeat this until you feel limber in your lower back.  Now, find your "sweet spot." This will be the most comfortable position somewhere between the two previous positions. This is your neutral spine. Once you have found this position, tense your stomach muscles to support your lower back.  STRENGTHENING EXERCISES - Low Back Strain These exercises may help you when beginning to rehabilitate your injury. These exercises should be done near your "sweet spot." This is the neutral, low-back arch, somewhere between fully rounded and fully arched, that is your least painful position. When performed in this safe range of motion, these exercises can be used for people who have either a flexion or extension based injury. These exercises may resolve your symptoms with or without further involvement from your physician, physical therapist or athletic trainer. While completing these exercises, remember:   Muscles can gain both the endurance and the strength needed for everyday activities through controlled exercises.  Complete these exercises as instructed by your physician, physical therapist or athletic trainer. Increase the resistance and repetitions only as guided.  You may experience muscle soreness or fatigue, but the pain or discomfort you are trying to eliminate should never worsen during these exercises. If this pain does worsen, stop and make certain you are following the directions exactly. If the pain is still present after adjustments, discontinue the exercise until you can discuss the trouble with your  caregiver.  STRENGTHENING - Deep Abdominals, Pelvic Tilt  Lie on a firm bed or floor. Keeping your legs in front of you, bend your knees so they are both pointed toward the ceiling and your feet are flat on the floor.  Tense your lower abdominal muscles to press your lower back into the floor. This motion will rotate your pelvis so that your tail bone is scooping upwards rather than pointing at your feet or into the floor.  STRENGTHENING - Abdominals, Crunches   Lie on a firm bed or floor. Keeping your legs in front of you, bend your knees so they are both pointed toward the ceiling and your feet are flat on the floor. Cross your arms over your chest.  Slightly tip your chin down without bending your neck.  Tense your abdominals and slowly lift your trunk high enough to just clear your shoulder  blades. Lifting higher can put excessive stress on the lower back and does not further strengthen your abdominal muscles.  Control your return to the starting position.  STRENGTHENING - Quadruped, Opposite UE/LE Lift   Assume a hands and knees position on a firm surface. Keep your hands under your shoulders and your knees under your hips. You may place padding under your knees for comfort.  Find your neutral spine and gently tense your abdominal muscles so that you can maintain this position. Your shoulders and hips should form a rectangle that is parallel with the floor and is not twisted.  Keeping your trunk steady, lift your right hand no higher than your shoulder and then your left leg no higher than your hip. Make sure you are not holding your breath.   Continuing to keep your abdominal muscles tense and your back steady, slowly return to your starting position. Repeat with the opposite arm and leg.  STRENGTHENING - Lower Abdominals, Double Knee Lift  Lie on a firm bed or floor. Keeping your legs in front of you, bend your knees so they are both pointed toward the ceiling and your feet are  flat on the floor.  Tense your abdominal muscles to brace your lower back and slowly lift both of your knees until they come over your hips. Be certain not to hold your breath.  POSTURE AND BODY MECHANICS CONSIDERATIONS - Low Back Strain Keeping correct posture when sitting, standing or completing your activities will reduce the stress put on different body tissues, allowing injured tissues a chance to heal and limiting painful experiences. The following are general guidelines for improved posture. Your physician or physical therapist will provide you with any instructions specific to your needs. While reading these guidelines, remember:  The exercises prescribed by your provider will help you have the flexibility and strength to maintain correct postures.  The correct posture provides the best environment for your joints to work. All of your joints have less wear and tear when properly supported by a spine with good posture. This means you will experience a healthier, less painful body.  Correct posture must be practiced with all of your activities, especially prolonged sitting and standing. Correct posture is as important when doing repetitive low-stress activities (typing) as it is when doing a single heavy-load activity (lifting). RESTING POSITIONS Consider which positions are most painful for you when choosing a resting position. If you have pain with flexion-based activities (sitting, bending, stooping, squatting), choose a position that allows you to rest in a less flexed posture. You would want to avoid curling into a fetal position on your side. If your pain worsens with extension-based activities (prolonged standing, working overhead), avoid resting in an extended position such as sleeping on your stomach. Most people will find more comfort when they rest with their spine in a more neutral position, neither too rounded nor too arched. Lying on a non-sagging bed on your side with a pillow  between your knees, or on your back with a pillow under your knees will often provide some relief. Keep in mind, being in any one position for a prolonged period of time, no matter how correct your posture, can still lead to stiffness. PROPER SITTING POSTURE In order to minimize stress and discomfort on your spine, you must sit with correct posture. Sitting with good posture should be effortless for a healthy body. Returning to good posture is a gradual process. Many people can work toward this most comfortably by using  various supports until they have the flexibility and strength to maintain this posture on their own. When sitting with proper posture, your ears will fall over your shoulders and your shoulders will fall over your hips. You should use the back of the chair to support your upper back. Your lower back will be in a neutral position, just slightly arched. You may place a small pillow or folded towel at the base of your lower back for support.  When working at a desk, create an environment that supports good, upright posture. Without extra support, muscles tire, which leads to excessive strain on joints and other tissues. Keep these recommendations in mind: CHAIR:  A chair should be able to slide under your desk when your back makes contact with the back of the chair. This allows you to work closely.  The chair's height should allow your eyes to be level with the upper part of your monitor and your hands to be slightly lower than your elbows. BODY POSITION  Your feet should make contact with the floor. If this is not possible, use a foot rest.  Keep your ears over your shoulders. This will reduce stress on your neck and lower back. INCORRECT SITTING POSTURES  If you are feeling tired and unable to assume a healthy sitting posture, do not slouch or slump. This puts excessive strain on your back tissues, causing more damage and pain. Healthier options include:  Using more support, like a  lumbar pillow.  Switching tasks to something that requires you to be upright or walking.  Talking a brief walk.  Lying down to rest in a neutral-spine position. PROLONGED STANDING WHILE SLIGHTLY LEANING FORWARD  When completing a task that requires you to lean forward while standing in one place for a long time, place either foot up on a stationary 2-4 inch high object to help maintain the best posture. When both feet are on the ground, the lower back tends to lose its slight inward curve. If this curve flattens (or becomes too large), then the back and your other joints will experience too much stress, tire more quickly, and can cause pain. CORRECT STANDING POSTURES Proper standing posture should be assumed with all daily activities, even if they only take a few moments, like when brushing your teeth. As in sitting, your ears should fall over your shoulders and your shoulders should fall over your hips. You should keep a slight tension in your abdominal muscles to brace your spine. Your tailbone should point down to the ground, not behind your body, resulting in an over-extended swayback posture.  INCORRECT STANDING POSTURES  Common incorrect standing postures include a forward head, locked knees and/or an excessive swayback. WALKING Walk with an upright posture. Your ears, shoulders and hips should all line-up. PROLONGED ACTIVITY IN A FLEXED POSITION When completing a task that requires you to bend forward at your waist or lean over a low surface, try to find a way to stabilize 3 out of 4 of your limbs. You can place a hand or elbow on your thigh or rest a knee on the surface you are reaching across. This will provide you more stability so that your muscles do not fatigue as quickly. By keeping your knees relaxed, or slightly bent, you will also reduce stress across your lower back. CORRECT LIFTING TECHNIQUES DO :   Assume a wide stance. This will provide you more stability and the opportunity  to get as close as possible to the object which  you are lifting.  Tense your abdominals to brace your spine. Bend at the knees and hips. Keeping your back locked in a neutral-spine position, lift using your leg muscles. Lift with your legs, keeping your back straight.  Test the weight of unknown objects before attempting to lift them.  Try to keep your elbows locked down at your sides in order get the best strength from your shoulders when carrying an object.  Always ask for help when lifting heavy or awkward objects. INCORRECT LIFTING TECHNIQUES DO NOT:   Lock your knees when lifting, even if it is a small object.  Bend and twist. Pivot at your feet or move your feet when needing to change directions.  Assume that you can safely pick up even a paper clip without proper posture.   Cervical Strain and Sprain Rehab Ask your health care provider which exercises are safe for you. Do exercises exactly as told by your health care provider and adjust them as directed. It is normal to feel mild stretching, pulling, tightness, or discomfort as you do these exercises, but you should stop right away if you feel sudden pain or your pain gets worse.Do not begin these exercises until told by your health care provider. Stretching and range of motion exercises These exercises warm up your muscles and joints and improve the movement and flexibility of your neck. These exercises also help to relieve pain, numbness, and tingling. Exercise A: Cervical side bend  1. Using good posture, sit on a stable chair or stand up. 2. Without moving your shoulders, slowly tilt your left / right ear to your shoulder until you feel a stretch in your neck muscles. You should be looking straight ahead. 3. Hold for __________ seconds. 4. Repeat with the other side of your neck. Repeat __________ times. Complete this exercise __________ times a day. Exercise B: Cervical rotation  1. Using good posture, sit on a stable  chair or stand up. 2. Slowly turn your head to the side as if you are looking over your left / right shoulder. ? Keep your eyes level with the ground. ? Stop when you feel a stretch along the side and the back of your neck. 3. Hold for __________ seconds. 4. Repeat this by turning to your other side. Repeat __________ times. Complete this exercise __________ times a day. Exercise C: Thoracic extension and pectoral stretch 1. Roll a towel or a small blanket so it is about 4 inches (10 cm) in diameter. 2. Lie down on your back on a firm surface. 3. Put the towel lengthwise, under your spine in the middle of your back. It should not be not under your shoulder blades. The towel should line up with your spine from your middle back to your lower back. 4. Put your hands behind your head and let your elbows fall out to your sides. 5. Hold for __________ seconds. Repeat __________ times. Complete this exercise __________ times a day. Strengthening exercises These exercises build strength and endurance in your neck. Endurance is the ability to use your muscles for a long time, even after your muscles get tired. Exercise D: Upper cervical flexion, isometric 1. Lie on your back with a thin pillow behind your head and a small rolled-up towel under your neck. 2. Gently tuck your chin toward your chest and nod your head down to look toward your feet. Do not lift your head off the pillow. 3. Hold for __________ seconds. 4. Release the tension slowly. Relax your  neck muscles completely before you repeat this exercise. Repeat __________ times. Complete this exercise __________ times a day. Exercise E: Cervical extension, isometric  1. Stand about 6 inches (15 cm) away from a wall, with your back facing the wall. 2. Place a soft object, about 6-8 inches (15-20 cm) in diameter, between the back of your head and the wall. A soft object could be a small pillow, a ball, or a folded towel. 3. Gently tilt your head  back and press into the soft object. Keep your jaw and forehead relaxed. 4. Hold for __________ seconds. 5. Release the tension slowly. Relax your neck muscles completely before you repeat this exercise. Repeat __________ times. Complete this exercise __________ times a day. Posture and body mechanics  Body mechanics refers to the movements and positions of your body while you do your daily activities. Posture is part of body mechanics. Good posture and healthy body mechanics can help to relieve stress in your body's tissues and joints. Good posture means that your spine is in its natural S-curve position (your spine is neutral), your shoulders are pulled back slightly, and your head is not tipped forward. The following are general guidelines for applying improved posture and body mechanics to your everyday activities. Standing  When standing, keep your spine neutral and keep your feet about hip-width apart. Keep a slight bend in your knees. Your ears, shoulders, and hips should line up.  When you do a task in which you stand in one place for a long time, place one foot up on a stable object that is 2-4 inches (5-10 cm) high, such as a footstool. This helps keep your spine neutral. Sitting   When sitting, keep your spine neutral and your keep feet flat on the floor. Use a footrest, if necessary, and keep your thighs parallel to the floor. Avoid rounding your shoulders, and avoid tilting your head forward.  When working at a desk or a computer, keep your desk at a height where your hands are slightly lower than your elbows. Slide your chair under your desk so you are close enough to maintain good posture.  When working at a computer, place your monitor at a height where you are looking straight ahead and you do not have to tilt your head forward or downward to look at the screen. Resting When lying down and resting, avoid positions that are most painful for you. Try to support your neck in a  neutral position. You can use a contour pillow or a small rolled-up towel. Your pillow should support your neck but not push on it. This information is not intended to replace advice given to you by your health care provider. Make sure you discuss any questions you have with your health care provider. Document Released: 02/09/2005 Document Revised: 10/17/2015 Document Reviewed: 01/16/2015 Elsevier Interactive Patient Education  2018 [employer].       IF you received an x-ray today, you will receive an invoice from [facility]. Please contact [facility] at [phone] with questions or concerns regarding your invoice.   IF you received labwork today, you will receive an invoice from [place]. Please contact [facility] at [phone] with questions or concerns regarding your invoice.   Our billing staff will not be able to assist you with questions regarding bills from these companies.  You will be contacted with the lab results as soon as they are available. The fastest way to get your results is to activate your My Chart account.  Instructions are located on the last page of this paperwork. If you have not heard from [place] regarding the results in 2 weeks, please contact this office.

## 2017-05-07 ENCOUNTER — Telehealth: Payer: Self-pay

## 2017-05-07 NOTE — Telephone Encounter (Signed)
-----   Message from Leonie Douglas, PA-C sent at 05/06/2017  6:51 PM EDT ----- Please call patient report that her x-ray did not show any acute fracture or dislocation.  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   Thanks.

## 2017-05-07 NOTE — Telephone Encounter (Signed)
Attempted to call pt with message re: imaging of neck from [place] PA  Cell VM box was full-unable to leave message Home phone - no answer/no answering machine.

## 2017-05-10 ENCOUNTER — Encounter: Payer: Self-pay | Admitting: Physician Assistant

## 2017-07-21 ENCOUNTER — Ambulatory Visit: Payer: BC Managed Care – PPO | Admitting: Physician Assistant

## 2017-07-21 ENCOUNTER — Encounter: Payer: Self-pay | Admitting: Physician Assistant

## 2017-07-21 ENCOUNTER — Other Ambulatory Visit: Payer: Self-pay

## 2017-07-21 VITALS — BP 120/86 | HR 65 | Temp 98.1°F | Resp 16 | Ht 69.0 in | Wt 208.2 lb

## 2017-07-21 DIAGNOSIS — H6122 Impacted cerumen, left ear: Secondary | ICD-10-CM | POA: Diagnosis not present

## 2017-07-21 NOTE — Patient Instructions (Addendum)
-   Please do not use Q-tips, as this will further impact the ear wax in your ear.   - If you have cerumen impaction more than once a year you can reduce the occurrences by using a cotton ball dipped in mineral oil and place it in the external canal for 10-20 minutes once a week (combined with eight hours of not using a hearing aid overnight, if applicable). This helps liquify cerumen and aid in the normal elimination mechanisms.    - You can also try a liquid stool softener. It works by dissolving or loosening the earwax, allowing it to be more easily removed upon irrigation. Lie on your side with the affected ear facing up and instill warmed (not hot) 1 mL (about 15 drops) of liquid Colace into the ear. Let the liquid sit in the ear for 10-15 minutes so it can work. After 15 minutes, rinse the affected ear with warm water so that you can get the softened earwax out of your canal.   - Routine cleaning of ears by a health professional every 6-12 months is recommended (if needed).   - If you have itchy ears, use sweet oil. If you need more relief use a small amount of hydrocortisone ointment.   Thank you for coming in today. I hope you feel we met your needs.  Feel free to call PCP if you have any questions or further requests.  Please consider signing up for MyChart if you do not already have it, as this is a great way to communicate with me.  Best,  Whitney McVey, PA-C    IF you received an x-ray today, you will receive an invoice from [facility]. Please contact [facility] at [phone] with questions or concerns regarding your invoice.   IF you received labwork today, you will receive an invoice from [place]. Please contact [facility] at [phone] with questions or concerns regarding your invoice.   Our billing staff will not be able to assist you with questions regarding bills from these companies.  You will be contacted with the lab results as soon as they are  available. The fastest way to get your results is to activate your My Chart account. Instructions are located on the last page of this paperwork. If you have not heard from [place] regarding the results in 2 weeks, please contact this office.

## 2017-07-21 NOTE — Progress Notes (Signed)
   Pamela Gray  MRN: [id number] DOB: 10/13/1970  PCP: Lujean Amel, MD  Subjective:  Pt is a  47 year old female who presents to clinic for left ear fullness. Over the past few weeks she has noticed "popping" of left ear. The past few days hearing has decreased.  Denies drainage, pain, mouth pain, fever, chills, ear pain.  She has put a few drops in her ear, not helping.  No h/o cerumen impaction.   Review of Systems  HENT: Positive for hearing loss (left). Negative for congestion, dental problem, ear discharge, ear pain, sinus pressure, sinus pain and tinnitus.     Patient Active Problem List   Diagnosis Date Noted  . Gestational diabetes 04/08/2013  . Status post repeat low transverse cesarean section 04/07/2013  . Preeclampsia 04/05/2013  . Dense breasts 04/06/2012  . Breast calcification, left 04/06/2012  . 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 naproxen (NAPROSYN) 500 MG tablet Take 1 tablet (500 mg total) by mouth 2 (two) times daily with a meal. 30 tablet 0  . cyclobenzaprine (FLEXERIL) 5 MG tablet Take 1 tablet (5 mg total) by mouth 3 (three) times daily as needed for muscle spasms. (Patient not taking: Reported on 07/21/2017) 30 tablet 0  . ferrous gluconate (FERGON) 324 MG tablet Take 1 tablet (324 mg total) by mouth 2 (two) times daily with a meal. (Patient not taking: Reported on 07/21/2017) 60 tablet 0   No current facility-administered medications on file prior to visit.     No Known Allergies   Objective:  BP 120/86 (BP Location: Left Arm, Patient Position: Sitting, Cuff Size: Normal)   Pulse 65   Temp 98.1 F (36.7 C) (Oral)   Resp 16   Ht 5\' 9"  (1.753 m)   Wt 208 lb 3.2 oz (94.4 kg)   LMP 07/16/2017   SpO2 100%   BMI 30.75 kg/m   Physical Exam  Constitutional: She is oriented to person, place, and time. No distress.  HENT:  Right Ear:  External ear and ear canal normal.  Left Ear: External ear and ear canal normal.  B/l cerumen impaction.  Following ear lavage, TM visualize and are pearly gray with mild erythema. No bulging, perforation or effusion.    Neurological: She is alert and oriented to person, place, and time.  Skin: Skin is warm and dry.  Psychiatric: Judgment normal.  Vitals reviewed.   Assessment and Plan :  1. Impacted cerumen of left ear - Ear wax removal - pt presents for cerumen impaction. B/l ear lavage performed by CMA followed by maximum relief. Discussed with pt ear canal hygiene. RTC PRN.   Mercer Pod, PA-C  [facility] 07/21/2017 2:02 PM

## 2018-05-03 ENCOUNTER — Encounter: Payer: Self-pay | Admitting: Family Medicine

## 2018-05-03 ENCOUNTER — Ambulatory Visit: Payer: BC Managed Care – PPO | Admitting: Family Medicine

## 2018-05-03 ENCOUNTER — Other Ambulatory Visit: Payer: Self-pay

## 2018-05-03 VITALS — BP 119/75 | HR 85 | Temp 98.6°F | Resp 18 | Ht 69.0 in | Wt 211.1 lb

## 2018-05-03 DIAGNOSIS — H00011 Hordeolum externum right upper eyelid: Secondary | ICD-10-CM | POA: Diagnosis not present

## 2018-05-03 MED ORDER — ERYTHROMYCIN 5 MG/GM OP OINT: 1.0000 "application " | TOPICAL_OINTMENT | Freq: Three times a day (TID) | OPHTHALMIC | 1 refills | Status: DC

## 2018-05-03 NOTE — Progress Notes (Signed)
3/10/20203:30 PM  [facility] 1971-01-31, 49 y.o. female [id number]  Chief Complaint  Patient presents with  . Stye    on right eye x2weeks     HPI:   Patient is a 48 y.o. female who presents today for stye x 2 weeks  Has been using warm compresses and OTC stye drops Better but not resolved Has not been using eye makeup Right upper eyelid  Fall Risk  05/03/2018 07/21/2017 05/06/2017 03/03/2017  Falls in the past year? 0 No No No  Follow up Falls evaluation completed - - -     Depression screen [facility] 2/9 05/03/2018 07/21/2017 05/06/2017  Decreased Interest 0 0 0  Down, Depressed, Hopeless 0 0 0  PHQ - 2 Score 0 0 0    No Known Allergies  Prior to Admission medications   Medication Sig Start Date End Date Taking? Authorizing Provider  cyclobenzaprine (FLEXERIL) 5 MG tablet Take 1 tablet (5 mg total) by mouth 3 (three) times daily as needed for muscle spasms. Patient not taking: Reported on 07/21/2017 05/06/17   Tenna Delaine D, PA-C  ferrous gluconate (FERGON) 324 MG tablet Take 1 tablet (324 mg total) by mouth 2 (two) times daily with a meal. Patient not taking: Reported on 07/21/2017 03/04/17   Tereasa Coop, PA-C  naproxen (NAPROSYN) 500 MG tablet Take 1 tablet (500 mg total) by mouth 2 (two) times daily with a meal. Patient not taking: Reported on 05/03/2018 05/06/17   Leonie Douglas, PA-C    Past Medical History:  Diagnosis Date  . Depression     Past Surgical History:  Procedure Laterality Date  . BILATERAL SALPINGECTOMY  04/04/2013   Procedure: BILATERAL SALPINGECTOMY;  Surgeon: Ena Dawley, MD;  Location: [facility];  Service: Obstetrics;;  . Cope    . CESAREAN SECTION N/A 04/04/2013   Procedure: CESAREAN SECTION;  Surgeon: Ena Dawley, MD;  Location: [facility];  Service: Obstetrics;  Laterality: N/A;  . SMALL INTESTINE SURGERY      Social History   Tobacco Use  . Smoking status: Former Smoker    Types: Cigarettes  . Smokeless  tobacco: Never Used  Substance Use Topics  . Alcohol use: Yes    Alcohol/week: 8.0 standard drinks    Types: 7 Glasses of wine, 1 Shots of liquor per week    Family History  Problem Relation Age of Onset  . Hypertension Mother   . Hyperlipidemia Mother   . Stroke Father   . Hypertension Father   . Diabetes Father   . Prostate cancer Father   . Diabetes Maternal Grandmother   . Hypertension Maternal Grandmother   . Hyperlipidemia Maternal Grandmother   . Diabetes Maternal Grandfather   . Prostate cancer Maternal Grandfather   . Diabetes Paternal Grandfather   . Prostate cancer Paternal Grandfather   . Hypertension Paternal Grandfather     ROS Per hpi  OBJECTIVE:  Blood pressure 119/75, pulse 85, temperature 98.6 F (37 C), temperature source Oral, resp. rate 18, height 5\' 9"  (1.753 m), weight 211 lb 1.6 oz (95.8 kg), last menstrual period 04/20/2018, SpO2 97 %. Body mass index is 31.17 kg/m.    Visual Acuity Screening   Right eye Left eye Both eyes  Without correction: 20/20 20/15 20/15   With correction:       Physical Exam Vitals signs and nursing note reviewed.  Constitutional:      Appearance: Normal appearance.  Eyes:     General:  Right eye: Hordeolum present.     Extraocular Movements: Extraocular movements intact.     Conjunctiva/sclera: Conjunctivae normal.  Neurological:     Mental Status: She is alert.     ASSESSMENT and PLAN  1. Hordeolum externum of right upper eyelid Discussed supportive measures, new meds r/se/b and RTC precautions. Patient educational handout given. - Visual acuity screening    Return if symptoms worsen or fail to improve.    Rutherford Guys, MD [facility] [place], [place] [address] Ph.  [phone] Fax [phone]

## 2018-05-03 NOTE — Patient Instructions (Addendum)
If you have lab work done today you will be contacted with your lab results within the next 2 weeks.  If you have not heard from [place] then please contact us. The fastest way to get your results is to register for My Chart.   IF you received an x-ray today, you will receive an invoice from [facility]. Please contact [facility] at [phone] with questions or concerns regarding your invoice.   IF you received labwork today, you will receive an invoice from [facility]. Please contact [facility] at [phone] with questions or concerns regarding your invoice.   Our billing staff will not be able to assist you with questions regarding bills from these companies.  You will be contacted with the lab results as soon as they are available. The fastest way to get your results is to activate your My Chart account. Instructions are located on the last page of this paperwork. If you have not heard from [place] regarding the results in 2 weeks, please contact this office.     Stye  A stye, also known as a hordeolum, is a bump that forms on an eyelid. It may look like a pimple next to the eyelash. A stye can form inside the eyelid (internal stye) or outside the eyelid (external stye). A stye can cause redness, swelling, and pain on the eyelid. Styes are very common. Anyone can get them at any age. They usually occur in just one eye, but you may have more than one in either eye. What are the causes? A stye is caused by an infection. The infection is almost always caused by bacteria called Staphylococcus aureus. This is a common type of bacteria that lives on the skin. An internal stye may result from an infected oil-producing gland inside the eyelid. An external stye may be caused by an infection at the base of the eyelash (hair follicle). What increases the risk? You are more likely to develop a stye if:  You have had a stye before.  You have any of these  conditions: ? Diabetes. ? Red, itchy, inflamed eyelids (blepharitis). ? A skin condition such as seborrheic dermatitis or rosacea. ? High fat levels in your blood (lipids). What are the signs or symptoms? The most common symptom of a stye is eyelid pain. Internal styes are more painful than external styes. Other symptoms may include:  Painful swelling of your eyelid.  A scratchy feeling in your eye.  Tearing and redness of your eye.  Pus draining from the stye. How is this diagnosed? Your health care provider may be able to diagnose a stye just by examining your eye. The health care provider may also check to make sure:  You do not have a fever or other signs of a more serious infection.  The infection has not spread to other parts of your eye or areas around your eye. How is this treated? Most styes will clear up in a few days without treatment or with warm compresses applied to the area. You may need to use antibiotic drops or ointment to treat an infection. In some cases, if your stye does not heal with routine treatment, your health care provider may drain pus from the stye using a thin blade or needle. This may be done if the stye is large, causing a lot of pain, or affecting your vision. Follow these instructions at home:  Take over-the-counter and prescription medicines only as told by your health care provider. This includes  eye drops or ointments.  If you were prescribed an antibiotic medicine, apply or use it as told by your health care provider. Do not stop using the antibiotic even if your condition improves.  Apply a warm, wet cloth (warm compress) to your eye for 5-10 minutes, 4 times a day.  Clean the affected eyelid as directed by your health care provider.  Do not wear contact lenses or eye makeup until your stye has healed.  Do not try to pop or drain the stye.  Do not rub your eye. Contact a health care provider if:  You have chills or a fever.  Your stye  does not go away after several days.  Your stye affects your vision.  Your eyeball becomes swollen, red, or painful. Get help right away if:  You have pain when moving your eye around. Summary  A stye is a bump that forms on an eyelid. It may look like a pimple next to the eyelash.  A stye can form inside the eyelid (internal stye) or outside the eyelid (external stye). A stye can cause redness, swelling, and pain on the eyelid.  Your health care provider may be able to diagnose a stye just by examining your eye.  Apply a warm, wet cloth (warm compress) to your eye for 5-10 minutes, 4 times a day. This information is not intended to replace advice given to you by your health care provider. Make sure you discuss any questions you have with your health care provider. Document Released: 11/19/2004 Document Revised: 10/22/2016 Document Reviewed: 10/22/2016 Elsevier Interactive Patient Education  2019 [employer].

## 2020-01-02 ENCOUNTER — Other Ambulatory Visit: Payer: Self-pay | Admitting: Obstetrics and Gynecology

## 2020-01-02 DIAGNOSIS — Z1231 Encounter for screening mammogram for malignant neoplasm of breast: Secondary | ICD-10-CM

## 2020-01-05 ENCOUNTER — Other Ambulatory Visit: Payer: Self-pay

## 2020-01-05 ENCOUNTER — Ambulatory Visit
Admission: RE | Admit: 2020-01-05 | Discharge: 2020-01-05 | Disposition: A | Discharge status: Home / Self Care | Payer: BC Managed Care – PPO | Source: Ambulatory Visit | Attending: Obstetrics and Gynecology | Admitting: Obstetrics and Gynecology

## 2020-01-05 DIAGNOSIS — Z1231 Encounter for screening mammogram for malignant neoplasm of breast: Secondary | ICD-10-CM

## 2020-01-10 ENCOUNTER — Other Ambulatory Visit: Payer: Self-pay | Admitting: Obstetrics and Gynecology

## 2020-01-10 DIAGNOSIS — R928 Other abnormal and inconclusive findings on diagnostic imaging of breast: Secondary | ICD-10-CM

## 2020-01-25 ENCOUNTER — Other Ambulatory Visit: Payer: Self-pay | Admitting: Obstetrics and Gynecology

## 2020-01-26 ENCOUNTER — Ambulatory Visit
Admission: RE | Admit: 2020-01-26 | Discharge: 2020-01-26 | Disposition: A | Discharge status: Home / Self Care | Payer: BC Managed Care – PPO | Source: Ambulatory Visit | Attending: Obstetrics and Gynecology | Admitting: Obstetrics and Gynecology

## 2020-01-26 ENCOUNTER — Other Ambulatory Visit: Payer: Self-pay | Admitting: Obstetrics and Gynecology

## 2020-01-26 ENCOUNTER — Other Ambulatory Visit: Payer: Self-pay

## 2020-01-26 DIAGNOSIS — R921 Mammographic calcification found on diagnostic imaging of breast: Secondary | ICD-10-CM

## 2020-01-26 DIAGNOSIS — R928 Other abnormal and inconclusive findings on diagnostic imaging of breast: Secondary | ICD-10-CM
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
## 2020-08-09 ENCOUNTER — Ambulatory Visit
Admission: RE | Admit: 2020-08-09 | Discharge: 2020-08-09 | Disposition: A | Discharge status: Home / Self Care | Payer: BC Managed Care – PPO | Source: Ambulatory Visit | Attending: Obstetrics and Gynecology | Admitting: Obstetrics and Gynecology

## 2020-08-09 ENCOUNTER — Other Ambulatory Visit: Payer: Self-pay | Admitting: Obstetrics and Gynecology

## 2020-08-09 ENCOUNTER — Other Ambulatory Visit: Payer: Self-pay

## 2020-08-09 DIAGNOSIS — R921 Mammographic calcification found on diagnostic imaging of breast: Secondary | ICD-10-CM

## 2020-09-11 ENCOUNTER — Other Ambulatory Visit: Payer: BC Managed Care – PPO

## 2021-07-17 DIAGNOSIS — Z0289 Encounter for other administrative examinations: Secondary | ICD-10-CM

## 2021-09-02 ENCOUNTER — Ambulatory Visit (INDEPENDENT_AMBULATORY_CARE_PROVIDER_SITE_OTHER): Payer: BC Managed Care – PPO | Admitting: Family Medicine

## 2021-09-02 ENCOUNTER — Encounter (INDEPENDENT_AMBULATORY_CARE_PROVIDER_SITE_OTHER): Payer: Self-pay | Admitting: Family Medicine

## 2021-09-02 VITALS — BP 108/71 | HR 79 | Temp 97.8°F | Ht 69.0 in | Wt 238.0 lb

## 2021-09-02 DIAGNOSIS — F5089 Other specified eating disorder: Secondary | ICD-10-CM | POA: Diagnosis not present

## 2021-09-02 DIAGNOSIS — F3289 Other specified depressive episodes: Secondary | ICD-10-CM

## 2021-09-02 DIAGNOSIS — R7303 Prediabetes: Secondary | ICD-10-CM | POA: Diagnosis not present

## 2021-09-02 DIAGNOSIS — Z13220 Encounter for screening for lipoid disorders: Secondary | ICD-10-CM

## 2021-09-02 DIAGNOSIS — E559 Vitamin D deficiency, unspecified: Secondary | ICD-10-CM | POA: Diagnosis not present

## 2021-09-02 DIAGNOSIS — M1711 Unilateral primary osteoarthritis, right knee: Secondary | ICD-10-CM

## 2021-09-02 DIAGNOSIS — F32A Depression, unspecified: Secondary | ICD-10-CM | POA: Insufficient documentation

## 2021-09-02 DIAGNOSIS — Z9189 Other specified personal risk factors, not elsewhere classified: Secondary | ICD-10-CM

## 2021-09-02 DIAGNOSIS — R0602 Shortness of breath: Secondary | ICD-10-CM | POA: Diagnosis not present

## 2021-09-02 DIAGNOSIS — Z1331 Encounter for screening for depression: Secondary | ICD-10-CM | POA: Diagnosis not present

## 2021-09-02 DIAGNOSIS — R5383 Other fatigue: Secondary | ICD-10-CM | POA: Diagnosis not present

## 2021-09-02 DIAGNOSIS — E669 Obesity, unspecified: Secondary | ICD-10-CM

## 2021-09-02 DIAGNOSIS — Z6835 Body mass index (BMI) 35.0-35.9, adult: Secondary | ICD-10-CM

## 2021-09-02 DIAGNOSIS — E66812 Obesity, class 2: Secondary | ICD-10-CM

## 2021-09-03 ENCOUNTER — Encounter (INDEPENDENT_AMBULATORY_CARE_PROVIDER_SITE_OTHER): Payer: Self-pay | Admitting: Family Medicine

## 2021-09-03 DIAGNOSIS — D509 Iron deficiency anemia, unspecified: Secondary | ICD-10-CM | POA: Insufficient documentation

## 2021-09-03 DIAGNOSIS — E785 Hyperlipidemia, unspecified: Secondary | ICD-10-CM | POA: Insufficient documentation

## 2021-09-03 LAB — CBC WITH DIFFERENTIAL/PLATELET
Basophils Absolute: 0.1 10*3/uL (ref 0.0–0.2)
Basos: 1 %
EOS (ABSOLUTE): 0.2 10*3/uL (ref 0.0–0.4)
Eos: 2 %
Hematocrit: 35.5 % (ref 34.0–46.6)
Hemoglobin: 10.4 g/dL — ABNORMAL LOW (ref 11.1–15.9)
Immature Grans (Abs): 0.1 10*3/uL (ref 0.0–0.1)
Immature Granulocytes: 1 %
Lymphocytes Absolute: 2.3 10*3/uL (ref 0.7–3.1)
Lymphs: 28 %
MCH: 20 pg — ABNORMAL LOW (ref 26.6–33.0)
MCHC: 29.3 g/dL — ABNORMAL LOW (ref 31.5–35.7)
MCV: 68 fL — ABNORMAL LOW (ref 79–97)
Monocytes Absolute: 0.6 10*3/uL (ref 0.1–0.9)
Monocytes: 8 %
Neutrophils Absolute: 5.1 10*3/uL (ref 1.4–7.0)
Neutrophils: 60 %
Platelets: 612 10*3/uL — ABNORMAL HIGH (ref 150–450)
RBC: 5.19 x10E6/uL (ref 3.77–5.28)
RDW: 19.2 % — ABNORMAL HIGH (ref 11.7–15.4)
WBC: 8.3 10*3/uL (ref 3.4–10.8)

## 2021-09-03 LAB — COMPREHENSIVE METABOLIC PANEL
ALT: 9 IU/L (ref 0–32)
AST: 15 IU/L (ref 0–40)
Albumin/Globulin Ratio: 1.4 (ref 1.2–2.2)
Albumin: 4.5 g/dL (ref 3.9–4.9)
Alkaline Phosphatase: 70 IU/L (ref 44–121)
BUN/Creatinine Ratio: 14 (ref 9–23)
BUN: 14 mg/dL (ref 6–24)
Bilirubin Total: 0.5 mg/dL (ref 0.0–1.2)
CO2: 22 mmol/L (ref 20–29)
Calcium: 10 mg/dL (ref 8.7–10.2)
Chloride: 101 mmol/L (ref 96–106)
Creatinine, Ser: 0.99 mg/dL (ref 0.57–1.00)
Globulin, Total: 3.3 g/dL (ref 1.5–4.5)
Glucose: 123 mg/dL — ABNORMAL HIGH (ref 70–99)
Potassium: 4.7 mmol/L (ref 3.5–5.2)
Sodium: 138 mmol/L (ref 134–144)
Total Protein: 7.8 g/dL (ref 6.0–8.5)
eGFR: 70 mL/min/{1.73_m2} (ref >59)

## 2021-09-03 LAB — TSH: TSH: 3.98 u[IU]/mL (ref 0.450–4.500)

## 2021-09-03 LAB — LIPID PANEL
Chol/HDL Ratio: 4.2 ratio (ref 0.0–4.4)
Cholesterol, Total: 279 mg/dL — ABNORMAL HIGH (ref 100–199)
HDL: 66 mg/dL (ref >39)
LDL Chol Calc (NIH): 194 mg/dL — ABNORMAL HIGH (ref 0–99)
Triglycerides: 108 mg/dL (ref 0–149)
VLDL Cholesterol Cal: 19 mg/dL (ref 5–40)

## 2021-09-03 LAB — HEMOGLOBIN A1C
Est. average glucose Bld gHb Est-mCnc: 140 mg/dL
Hgb A1c MFr Bld: 6.5 % — ABNORMAL HIGH (ref 4.8–5.6)

## 2021-09-03 LAB — INSULIN, RANDOM: INSULIN: 14.7 u[IU]/mL (ref 2.6–24.9)

## 2021-09-03 LAB — VITAMIN D 25 HYDROXY (VIT D DEFICIENCY, FRACTURES): Vit D, 25-Hydroxy: 25.1 ng/mL — ABNORMAL LOW (ref 30.0–100.0)

## 2021-09-03 LAB — VITAMIN B12: Vitamin B-12: 1441 pg/mL — ABNORMAL HIGH (ref 232–1245)

## 2021-09-03 LAB — T4, FREE: Free T4: 0.93 ng/dL (ref 0.82–1.77)

## 2021-09-03 NOTE — Progress Notes (Signed)
Chief Complaint:   OBESITY Pamela Gray (MR# [id number]) is a 51 y.o. female who presents for evaluation and treatment of obesity and related comorbidities. Current BMI is Body mass index is 35.15 kg/m. [name] has been struggling with her weight for many years and has been unsuccessful in either losing weight, maintaining weight loss, or reaching her healthy weight goal.  [name] lost weight before the pandemic 238 pounds down to 175 pounds with workouts and healthy eating.  Weight increased since more sedentary at work.  Skips breakfast.  Eating fast food and candy.  Drinks coffee with creamer 5 times per day.  Back to workouts 2 times per week.  Craves sweets, does not like to cook.  Tends to boredom and stress snack at work.  [name] is currently in the action stage of change and ready to dedicate time achieving and maintaining a healthier weight. [name] is interested in becoming our patient and working on intensive lifestyle modifications including (but not limited to) diet and exercise for weight loss.  [name]'s habits were reviewed today and are as follows: Her family eats meals together, she struggles with family and or coworkers weight loss sabotage, her desired weight loss is 58-63 lbs, she started gaining weight post pandemic; during pandemic after her last daughter, her heaviest weight ever was 240 pounds, she is a picky eater and doesn't like to eat healthier foods, she has significant food cravings issues, she snacks frequently in the evenings, she skips meals frequently, she is frequently drinking liquids with calories, she frequently makes poor food choices, she has problems with excessive hunger, she frequently eats larger portions than normal, and she struggles with emotional eating.  Depression Screen Pamela Gray's Food and Mood (modified PHQ-9) score was 11.     09/02/2021    7:40 AM  Depression screen PHQ 2/9  Decreased Interest 2  Down, Depressed, Hopeless 1  PHQ - 2 Score 3   Altered sleeping 0  Tired, decreased energy 3  Change in appetite 3  Feeling bad or failure about yourself  1  Trouble concentrating 1  Moving slowly or fidgety/restless 0  Suicidal thoughts 0  PHQ-9 Score 11  Difficult doing work/chores Not difficult at all   Subjective:   1. Other fatigue [name] admits to daytime somnolence and admits to waking up still tired. Patient has a history of symptoms of daytime fatigue and morning fatigue. [name] generally gets 6 or 7 hours of sleep per night, and states that she has nightime awakenings. Snoring is not present. Apneic episodes are not present. Epworth Sleepiness Score is 9.   2. SOBOE (shortness of breath on exertion) Jennelle notes increasing shortness of breath with exercising and seems to be worsening over time with weight gain. She notes getting out of breath sooner with activity than she used to. This has not gotten worse recently. [name] denies shortness of breath at rest or orthopnea.  Expected BMR 2006, actual BMR 1714, less than expected.  3. Prediabetes Pamela Gray is currently in taking candy and increased amounts of sweetened coffee creamer  4. Osteoarthritis of right knee, unspecified osteoarthritis type Pamela Gray is seeing Ortho, on NSAIDs twice daily.  Little limitation with exercise.  5. Vitamin D deficiency Pamela Gray is not on vitamin D supplementation.  6. Screening, lipid Azalynn is due for labs.  7. Other depression, with emotional eating Terriona's PHQ-9 score is 11.  Good support at home.  She declines the need for treatment.  8. At risk for diabetes  mellitus Pamela Gray is at higher than average risk for developing diabetes due to her obesity, increased sugar intake, family history, BMI 35.  Assessment/Plan:   1. Other fatigue [name] does feel that her weight is causing her energy to be lower than it should be. Fatigue may be related to obesity, depression or many other causes. Labs will be ordered, and in the meanwhile, Pamela Gray will focus  on self care including making healthy food choices, increasing physical activity and focusing on stress reduction.  - EKG 12-Lead - Vitamin B12 - CBC with Differential/Platelet - Comprehensive metabolic panel - T4, free - TSH  2. SOBOE (shortness of breath on exertion) [name] does feel that she gets out of breath more easily that she used to when she exercises. Pamela Gray's shortness of breath appears to be obesity related and exercise induced. She has agreed to work on weight loss and gradually increase exercise to treat her exercise induced shortness of breath. Will continue to monitor closely.  3. Prediabetes We will check labs today, and we will follow-up at [name]'s next visit.  - Hemoglobin A1c - Insulin, random  4. Osteoarthritis of right knee, unspecified osteoarthritis type Pamela Gray will exercise as tolerated.  Continue to plan of care per Ortho.  She will work on weight loss.  5. Vitamin D deficiency We will check labs today, and we will follow-up at [name]'s next visit.  - VITAMIN D 25 Hydroxy (Vit-D Deficiency, Fractures)  6. Screening, lipid We will check labs today, and we will follow-up at Leyanna's next visit.  - Lipid panel  7. Other depression, with emotional eating We will consider Wellbutrin SR at Basha's next visit.  8. At risk for diabetes mellitus Pamela Gray was given approximately 15 minutes of diabetic education and counseling today. We discussed intensive lifestyle modifications today with an emphasis on weight loss as well as increasing exercise and decreasing simple carbohydrates in her diet. We also reviewed medication options with an emphasis on risk versus benefits of those discussed.  Repetitive spaced learning was employed today to elicit superior memory formation and behavioral change.   9. Obesity, current BMI 35.1 Pamela Gray is currently in the action stage of change and her goal is to continue with weight loss efforts. I recommend Lysa begin the structured  treatment plan as follows:  She has agreed to the Category 2 Plan.  Category 2 eating plan.  Labs updated today.  EKG reviewed today, normal sinus rhythm.  Exercise goals: As is.  Behavioral modification strategies: increasing lean protein intake, increasing water intake, decreasing eating out, no skipping meals, and decreasing junk food.  She was informed of the importance of frequent follow-up visits to maximize her success with intensive lifestyle modifications for her multiple health conditions. She was informed we would discuss her lab results at her next visit unless there is a critical issue that needs to be addressed sooner. Clarita agreed to keep her next visit at the agreed upon time to discuss these results.  Objective:   Blood pressure 108/71, pulse 79, temperature 97.8 F (36.6 C), height '5\' 9"'$  (1.753 m), weight 238 lb (108 kg), SpO2 97 %. Body mass index is 35.15 kg/m.  EKG: Normal sinus rhythm, rate 83 BPM.  Indirect Calorimeter completed today shows a VO2 of 249 and a REE of 1714.  Her calculated basal metabolic rate is 4128 thus her basal metabolic rate is worse than expected.  General: Cooperative, alert, well developed, in no acute distress. HEENT: Conjunctivae and lids unremarkable. Cardiovascular: Regular  rhythm.  Lungs: Normal work of breathing. Neurologic: No focal deficits.   Lab Results  Component Value Date   CREATININE 0.99 09/02/2021   BUN 14 09/02/2021   NA 138 09/02/2021   K 4.7 09/02/2021   CL 101 09/02/2021   CO2 22 09/02/2021   Lab Results  Component Value Date   ALT 9 09/02/2021   AST 15 09/02/2021   ALKPHOS 70 09/02/2021   BILITOT 0.5 09/02/2021   Lab Results  Component Value Date   HGBA1C 6.5 (H) 09/02/2021   HGBA1C 5.7 03/03/2017   Lab Results  Component Value Date   INSULIN WILL FOLLOW 09/02/2021   Lab Results  Component Value Date   TSH 3.980 09/02/2021   Lab Results  Component Value Date   CHOL 279 (H) 09/02/2021   HDL  66 09/02/2021   LDLCALC 194 (H) 09/02/2021   TRIG 108 09/02/2021   CHOLHDL 4.2 09/02/2021   Lab Results  Component Value Date   WBC 8.3 09/02/2021   HGB 10.4 (L) 09/02/2021   HCT 35.5 09/02/2021   MCV 68 (L) 09/02/2021   PLT 612 (H) 09/02/2021   Lab Results  Component Value Date   IRON 30 03/03/2017   TIBC 455 (H) 03/03/2017   FERRITIN 7 (L) 03/03/2017   Attestation Statements:   Reviewed by clinician on day of visit: allergies, medications, problem list, medical history, surgical history, family history, social history, and previous encounter notes.   Wilhemena Durie, am acting as transcriptionist for Loyal Gambler, DO.  I have reviewed the above documentation for accuracy and completeness, and I agree with the above. Collene Leyden Halen Mossbarger, DO \

## 2021-09-10 ENCOUNTER — Ambulatory Visit (INDEPENDENT_AMBULATORY_CARE_PROVIDER_SITE_OTHER): Payer: BC Managed Care – PPO | Admitting: Bariatrics

## 2021-09-16 ENCOUNTER — Encounter (INDEPENDENT_AMBULATORY_CARE_PROVIDER_SITE_OTHER): Payer: Self-pay | Admitting: Family Medicine

## 2021-09-16 ENCOUNTER — Ambulatory Visit (INDEPENDENT_AMBULATORY_CARE_PROVIDER_SITE_OTHER): Payer: BC Managed Care – PPO | Admitting: Family Medicine

## 2021-09-16 ENCOUNTER — Telehealth (INDEPENDENT_AMBULATORY_CARE_PROVIDER_SITE_OTHER): Payer: Self-pay

## 2021-09-16 ENCOUNTER — Other Ambulatory Visit (INDEPENDENT_AMBULATORY_CARE_PROVIDER_SITE_OTHER): Payer: Self-pay | Admitting: Family Medicine

## 2021-09-16 VITALS — BP 125/75 | HR 74 | Temp 98.2°F | Ht 69.0 in | Wt 238.0 lb

## 2021-09-16 DIAGNOSIS — D500 Iron deficiency anemia secondary to blood loss (chronic): Secondary | ICD-10-CM | POA: Diagnosis not present

## 2021-09-16 DIAGNOSIS — E559 Vitamin D deficiency, unspecified: Secondary | ICD-10-CM

## 2021-09-16 DIAGNOSIS — E119 Type 2 diabetes mellitus without complications: Secondary | ICD-10-CM

## 2021-09-16 DIAGNOSIS — E669 Obesity, unspecified: Secondary | ICD-10-CM

## 2021-09-16 DIAGNOSIS — E7849 Other hyperlipidemia: Secondary | ICD-10-CM

## 2021-09-16 DIAGNOSIS — Z6835 Body mass index (BMI) 35.0-35.9, adult: Secondary | ICD-10-CM

## 2021-09-16 MED ORDER — VITAMIN D (ERGOCALCIFEROL) 1.25 MG (50000 UNIT) PO CAPS: 50000.0000 [IU] | ORAL_CAPSULE | ORAL | 0 refills | Status: DC

## 2021-09-16 MED ORDER — OZEMPIC (0.25 OR 0.5 MG/DOSE) 2 MG/3ML ~~LOC~~ SOPN: 0.2500 mg | PEN_INJECTOR | SUBCUTANEOUS | 0 refills | Status: DC

## 2021-09-16 MED ORDER — TIRZEPATIDE 2.5 MG/0.5ML ~~LOC~~ SOAJ: 2.5000 mg | SUBCUTANEOUS | 0 refills | Status: DC

## 2021-09-16 MED ORDER — FERROUS GLUCONATE 324 (38 FE) MG PO TABS: 324.0000 mg | ORAL_TABLET | Freq: Every day | ORAL | 0 refills | Status: DC

## 2021-09-16 NOTE — Telephone Encounter (Signed)
Pt was seen in the office today and was started on Mounjaro. Please start process for Prior Auth.  Thank You!

## 2021-09-16 NOTE — Telephone Encounter (Signed)
Prior authorization started for [facility]. Will notify patient and provider once response is received. Thanks!

## 2021-09-17 ENCOUNTER — Telehealth (INDEPENDENT_AMBULATORY_CARE_PROVIDER_SITE_OTHER): Payer: Self-pay | Admitting: Family Medicine

## 2021-09-17 ENCOUNTER — Encounter (INDEPENDENT_AMBULATORY_CARE_PROVIDER_SITE_OTHER): Payer: Self-pay

## 2021-09-17 NOTE — Telephone Encounter (Signed)
Dr. Valetta Close - Prior authorization denied for [facility]. Per insurance: Formulary alternatives are: Ozempic, Rybelsus, Trulicity, Victoza. (Requirement: 3 in a class with 3 or more alternatives, 2 in a class with 2 alternatives, or 1 in a class with only 1 alternative.) Patient sent denial message via mychart.

## 2021-09-22 ENCOUNTER — Encounter (INDEPENDENT_AMBULATORY_CARE_PROVIDER_SITE_OTHER): Payer: Self-pay

## 2021-09-22 NOTE — Telephone Encounter (Signed)
Message sent to pt's MyChart  

## 2021-09-23 NOTE — Progress Notes (Signed)
Chief Complaint:   OBESITY Pamela Gray is here to discuss her progress with her obesity treatment plan along with follow-up of her obesity related diagnoses. Pamela Gray is on the Category 2 Plan and states she is following her eating plan approximately 50% of the time. Pamela Gray states she is doing 0 minutes 0 times per week.  Today's visit was #: 2 Starting weight: 238 lbs Starting date: 09/02/2021 Today's weight: 238 lbs Today's date: 09/16/2021 Total lbs lost to date: 0 Total lbs lost since last in-office visit: 0  Interim History: Pamela Gray did well on week 1 but had to travel on week 2.  Craving sugar especially at work.  Stress is high at work.  Plans to travel more for work.  Avoids sugar sweetened beverages but struggles to get enough water in.  Using personal trainer 2 times a week.  Snacking on junk food.  Subjective:   1. Vitamin D deficiency Nilda's vitamin D level was 25.1 on 09/02/2021.  I discussed labs with the patient today.  2. Iron deficiency anemia due to chronic blood loss Pamela Gray takes 2 iron Gummies and women's multivitamins daily.  She has talked with her OB/GYN about DUB.  I discussed labs with the patient today.  3. Other hyperlipidemia Pamela Gray's total cholesterol was 279, LDL 194 on 09/02/2021.  She notes a family history of hyperlipidemia.  I discussed labs with the patient today.  4. Type 2 diabetes mellitus without complication, without long-term current use of insulin (Pamela Gray) Ven[place] has a new diagnosis of type 2 diabetes mellitus.  Her A1c was 6.5 on 09/02/2021 in the range for type II diabetes.   I discussed labs with the patient today.  Assessment/Plan:   1. Vitamin D deficiency Pamela Gray will begin prescription vitamin D 50,000 units once weekly, with no refills.  - Vitamin D, Ergocalciferol, (DRISDOL) 1.25 MG (50000 UNIT) CAPS capsule; Take 1 capsule (50,000 Units total) by mouth every 7 (seven) days.  Dispense: 5 capsule; Refill: 0  2. Iron deficiency anemia due to  chronic blood loss Pamela Gray will change OTC iron to ferrous gluconate.  She will continue women's multivitamins daily.  - ferrous gluconate (FERGON) 324 MG tablet; Take 1 tablet (324 mg total) by mouth daily.  Dispense: 30 tablet; Refill: 0  3. Other hyperlipidemia Pamela Gray plans to discuss her lab results with her PCP.  4. Type 2 diabetes mellitus without complication, without long-term current use of insulin ([place]) Pamela Gray will continue her low carbohydrate and low sugar diet, and will increase her walking time.  She will begin Ozempic 0.25 mg once weekly, with no refills.  We reviewed MOA and potential adverse SEs of Ozempic.  She is not at risk for pregnancy and denies a personal or family hx of pancreatitis, MEN II or medullary thyroid carcinoma.  She understands Ozempic must be used along with a reduced calorie diet and a plan for regular exercise.     - Semaglutide,0.25 or 0.'5MG'$ /DOS, (OZEMPIC, 0.25 OR 0.5 MG/DOSE,) 2 MG/3ML SOPN; Inject 0.25 mg into the skin once a week.  Dispense: 3 mL; Refill: 0  5. Obesity, current BMI 35.1 Pamela Gray is currently in the action stage of change. As such, her goal is to continue with weight loss efforts. She has agreed to keeping a food journal and adhering to recommended goals of 1500 calories and 90 grams of protein daily.   Exercise goals: As is.   Behavioral modification strategies: increasing lean protein intake, increasing water intake, decreasing eating out, no  skipping meals, and keeping healthy foods in the home.  Pamela Gray has agreed to follow-up with our clinic in 3 weeks. She was informed of the importance of frequent follow-up visits to maximize her success with intensive lifestyle modifications for her multiple health conditions.   Objective:   Blood pressure 125/75, pulse 74, temperature 98.2 F (36.8 C), height '5\' 9"'$  (1.753 m), weight 238 lb (108 kg), SpO2 97 %. Body mass index is 35.15 kg/m.  General: Cooperative, alert, well developed, in no  acute distress. HEENT: Conjunctivae and lids unremarkable. Cardiovascular: Regular rhythm.  Lungs: Normal work of breathing. Neurologic: No focal deficits.   Lab Results  Component Value Date   CREATININE 0.99 09/02/2021   BUN 14 09/02/2021   NA 138 09/02/2021   K 4.7 09/02/2021   CL 101 09/02/2021   CO2 22 09/02/2021   Lab Results  Component Value Date   ALT 9 09/02/2021   AST 15 09/02/2021   ALKPHOS 70 09/02/2021   BILITOT 0.5 09/02/2021   Lab Results  Component Value Date   HGBA1C 6.5 (H) 09/02/2021   HGBA1C 5.7 03/03/2017   Lab Results  Component Value Date   INSULIN 14.7 09/02/2021   Lab Results  Component Value Date   TSH 3.980 09/02/2021   Lab Results  Component Value Date   CHOL 279 (H) 09/02/2021   HDL 66 09/02/2021   LDLCALC 194 (H) 09/02/2021   TRIG 108 09/02/2021   CHOLHDL 4.2 09/02/2021   Lab Results  Component Value Date   VD25OH 25.1 (L) 09/02/2021   Lab Results  Component Value Date   WBC 8.3 09/02/2021   HGB 10.4 (L) 09/02/2021   HCT 35.5 09/02/2021   MCV 68 (L) 09/02/2021   PLT 612 (H) 09/02/2021   Lab Results  Component Value Date   IRON 30 03/03/2017   TIBC 455 (H) 03/03/2017   FERRITIN 7 (L) 03/03/2017   Attestation Statements:   Reviewed by clinician on day of visit: allergies, medications, problem list, medical history, surgical history, family history, social history, and previous encounter notes.   Wilhemena Durie, am acting as transcriptionist for Loyal Gambler, DO.  I have reviewed the above documentation for accuracy and completeness, and I agree with the above. Dell Ponto, DO

## 2021-09-24 ENCOUNTER — Ambulatory Visit (INDEPENDENT_AMBULATORY_CARE_PROVIDER_SITE_OTHER): Payer: BC Managed Care – PPO | Admitting: Bariatrics

## 2021-10-01 ENCOUNTER — Encounter (INDEPENDENT_AMBULATORY_CARE_PROVIDER_SITE_OTHER): Payer: Self-pay

## 2021-10-08 ENCOUNTER — Encounter (INDEPENDENT_AMBULATORY_CARE_PROVIDER_SITE_OTHER): Payer: Self-pay | Admitting: Family Medicine

## 2021-10-08 ENCOUNTER — Ambulatory Visit (INDEPENDENT_AMBULATORY_CARE_PROVIDER_SITE_OTHER): Payer: BC Managed Care – PPO | Admitting: Family Medicine

## 2021-10-08 VITALS — BP 117/81 | HR 77 | Temp 98.7°F | Ht 69.0 in | Wt 235.0 lb

## 2021-10-08 DIAGNOSIS — E119 Type 2 diabetes mellitus without complications: Secondary | ICD-10-CM

## 2021-10-08 DIAGNOSIS — Z6835 Body mass index (BMI) 35.0-35.9, adult: Secondary | ICD-10-CM

## 2021-10-08 DIAGNOSIS — E7849 Other hyperlipidemia: Secondary | ICD-10-CM

## 2021-10-08 DIAGNOSIS — D508 Other iron deficiency anemias: Secondary | ICD-10-CM | POA: Diagnosis not present

## 2021-10-08 DIAGNOSIS — E559 Vitamin D deficiency, unspecified: Secondary | ICD-10-CM | POA: Diagnosis not present

## 2021-10-08 DIAGNOSIS — E669 Obesity, unspecified: Secondary | ICD-10-CM

## 2021-10-08 DIAGNOSIS — Z6834 Body mass index (BMI) 34.0-34.9, adult: Secondary | ICD-10-CM

## 2021-10-08 DIAGNOSIS — Z7985 Long-term (current) use of injectable non-insulin antidiabetic drugs: Secondary | ICD-10-CM

## 2021-10-08 MED ORDER — ATORVASTATIN CALCIUM 20 MG PO TABS: 20.0000 mg | ORAL_TABLET | Freq: Every day | ORAL | 0 refills | Status: AC

## 2021-10-08 MED ORDER — VITAMIN D (ERGOCALCIFEROL) 1.25 MG (50000 UNIT) PO CAPS: 50000.0000 [IU] | ORAL_CAPSULE | ORAL | 0 refills | Status: AC

## 2021-10-08 MED ORDER — OZEMPIC (0.25 OR 0.5 MG/DOSE) 2 MG/3ML ~~LOC~~ SOPN: 0.5000 mg | PEN_INJECTOR | SUBCUTANEOUS | 0 refills | Status: AC

## 2021-10-20 NOTE — Progress Notes (Signed)
Chief Complaint:   OBESITY Pamela Gray is here to discuss her progress with her obesity treatment plan along with follow-up of her obesity related diagnoses. Pamela Gray is on keeping a food journal and adhering to recommended goals of 1500 calories and 90 grams of protein daily and states she is following her eating plan approximately 60% of the time. Pamela Gray states she is doing weight training, HIT for 60 minutes 5 times per week.  Today's visit was #: 3 Starting weight: 238 lbs Starting date: 09/02/2021 Today's weight: 235 lbs Today's date: 10/08/2021 Total lbs lost to date: 3 Total lbs lost since last in-office visit: 3  Interim History: Pamela Gray's net weight loss is 3 pounds in the past month (retaining 2 pounds of water today).  She is doing fair on category 2 meal plan.  She denies meal skipping.  She has not traveled since her last visit.  Her support is good.  She has really increased workouts.  Subjective:   1. Type 2 diabetes mellitus without complication, without long-term current use of insulin ([facility]) Pamela Gray has a new diagnosis.  She is doing well on Ozempic 0.25 mg weekly injection.  She denies GERD or nausea.  Satiety is fair.  2. Other iron deficiency anemia Pamela Gray is on ferrous gluconate 324 mg daily, and she is tolerating it well.  Her energy level has improved.  She has a history of DUB.  3. Vitamin D deficiency Pamela Gray is on prescription vitamin D 50,000 units weekly.  Her energy level is improving.  4. Other hyperlipidemia Pamela Gray's LDL was high at 194 on 09/02/2021 and total cholesterol 279.  She is not on cholesterol-lowering medications.  She does not have a PCP.  She has a family history of hyperlipidemia.  Assessment/Plan:   1. Type 2 diabetes mellitus without complication, without long-term current use of insulin ([place]) Pamela Gray greed to increase Ozempic to 0.5 mg weekly injection, and we will refill for 1 month.  - Semaglutide,0.25 or 0.'5MG'$ /DOS, (OZEMPIC, 0.25 OR 0.5 MG/DOSE,)  2 MG/3ML SOPN; Inject 0.5 mg into the skin once a week.  Dispense: 3 mL; Refill: 0  2. Other iron deficiency anemia Pamela Gray will can use ferrous gluconate 324 mg daily.  We will recheck iron level in 2 to 3 months, routine colorectal  screening (age 23).   3. Vitamin D deficiency We will refill prescription vitamin D 50,000 units weekly for 1 month, and we will recheck labs in 2 to 3 months.  - Vitamin D, Ergocalciferol, (DRISDOL) 1.25 MG (50000 UNIT) CAPS capsule; Take 1 capsule (50,000 Units total) by mouth every 7 (seven) days.  Dispense: 5 capsule; Refill: 0  4. Other hyperlipidemia Pamela Gray greed to start Lipitor 20 mg once daily, with no refills.  We will recheck FLP and LFTs in 2 to 3 months.  - atorvastatin (LIPITOR) 20 MG tablet; Take 1 tablet (20 mg total) by mouth daily.  Dispense: 30 tablet; Refill: 0  5. Obesity, current BMI 34.8 Pamela Gray is currently in the action stage of change. As such, her goal is to continue with weight loss efforts. She has agreed to the Category 2 Plan and keeping a food journal and adhering to recommended goals of 1500 calories and 90 grams of protein daily.   Anticipate further weight reduction while on Ozempic 0.5 mg once weekly.  Exercise goals: As is.   Behavioral modification strategies: increasing lean protein intake, increasing water intake, decreasing eating out, no skipping meals, meal planning and cooking strategies, better  snacking choices, and decreasing junk food.  Pamela Gray has agreed to follow-up with our clinic in 3 weeks. She was informed of the importance of frequent follow-up visits to maximize her success with intensive lifestyle modifications for her multiple health conditions.   Objective:   Blood pressure 117/81, pulse 77, temperature 98.7 F (37.1 C), height '5\' 9"'$  (1.753 m), weight 235 lb (106.6 kg), SpO2 100 %. Body mass index is 34.7 kg/m.  General: Cooperative, alert, well developed, in no acute distress. HEENT: Conjunctivae and  lids unremarkable. Cardiovascular: Regular rhythm.  Lungs: Normal work of breathing. Neurologic: No focal deficits.   Lab Results  Component Value Date   CREATININE 0.99 09/02/2021   BUN 14 09/02/2021   NA 138 09/02/2021   K 4.7 09/02/2021   CL 101 09/02/2021   CO2 22 09/02/2021   Lab Results  Component Value Date   ALT 9 09/02/2021   AST 15 09/02/2021   ALKPHOS 70 09/02/2021   BILITOT 0.5 09/02/2021   Lab Results  Component Value Date   HGBA1C 6.5 (H) 09/02/2021   HGBA1C 5.7 03/03/2017   Lab Results  Component Value Date   INSULIN 14.7 09/02/2021   Lab Results  Component Value Date   TSH 3.980 09/02/2021   Lab Results  Component Value Date   CHOL 279 (H) 09/02/2021   HDL 66 09/02/2021   LDLCALC 194 (H) 09/02/2021   TRIG 108 09/02/2021   CHOLHDL 4.2 09/02/2021   Lab Results  Component Value Date   VD25OH 25.1 (L) 09/02/2021   Lab Results  Component Value Date   WBC 8.3 09/02/2021   HGB 10.4 (L) 09/02/2021   HCT 35.5 09/02/2021   MCV 68 (L) 09/02/2021   PLT 612 (H) 09/02/2021   Lab Results  Component Value Date   IRON 30 03/03/2017   TIBC 455 (H) 03/03/2017   FERRITIN 7 (L) 03/03/2017   Attestation Statements:   Reviewed by clinician on day of visit: allergies, medications, problem list, medical history, surgical history, family history, social history, and previous encounter notes.   Wilhemena Durie, am acting as transcriptionist for Loyal Gambler, DO.  I have reviewed the above documentation for accuracy and completeness, and I agree with the above. Dell Ponto, DO

## 2021-11-06 ENCOUNTER — Ambulatory Visit (INDEPENDENT_AMBULATORY_CARE_PROVIDER_SITE_OTHER): Payer: BC Managed Care – PPO | Admitting: Family Medicine

## 2023-05-07 ENCOUNTER — Telehealth: Payer: Self-pay | Admitting: Family Medicine

## 2023-05-07 DIAGNOSIS — R6889 Other general symptoms and signs: Secondary | ICD-10-CM | POA: Diagnosis not present

## 2023-05-07 MED ORDER — PROMETHAZINE-DM 6.25-15 MG/5ML PO SYRP: 5.0000 mL | ORAL_SOLUTION | Freq: Four times a day (QID) | ORAL | 0 refills | Status: AC | PRN

## 2023-05-07 NOTE — Patient Instructions (Signed)

## 2023-05-07 NOTE — Progress Notes (Signed)
 Virtual Visit Consent   [facility], you are scheduled for a virtual visit with a [facility] provider today. Just as with appointments in the office, your consent must be obtained to participate. Your consent will be active for this visit and any virtual visit you may have with one of our providers in the next 365 days. If you have a MyChart account, a copy of this consent can be sent to you electronically.  As this is a virtual visit, video technology does not allow for your provider to perform a traditional examination. This may limit your provider's ability to fully assess your condition. If your provider identifies any concerns that need to be evaluated in person or the need to arrange testing (such as labs, EKG, etc.), we will make arrangements to do so. Although advances in technology are sophisticated, we cannot ensure that it will always work on either your end or our end. If the connection with a video visit is poor, the visit may have to be switched to a telephone visit. With either a video or telephone visit, we are not always able to ensure that we have a secure connection.  By engaging in this virtual visit, you consent to the provision of healthcare and authorize for your insurance to be billed (if applicable) for the services provided during this visit. Depending on your insurance coverage, you may receive a charge related to this service.  I need to obtain your verbal consent now. Are you willing to proceed with your visit today? Pamela Gray has provided verbal consent on 05/07/2023 for a virtual visit (video or telephone). Georgana Curio, FNP  Date: 05/07/2023 3:18 PM   Virtual Visit via Video Note   I, Georgana Curio, connected with  Pamela Gray  ([id number], Jul 22, 1970) on 05/07/23 at  3:15 PM EDT by a video-enabled telemedicine application and verified that I am speaking with the correct person using two identifiers.  Location: Patient: Virtual Visit Location Patient:  Home Provider: Virtual Visit Location Provider: Home Office   I discussed the limitations of evaluation and management by telemedicine and the availability of in person appointments. The patient expressed understanding and agreed to proceed.    History of Present Illness: Pamela Gray is a 53 y.o. who identifies as a female who was assigned female at birth, and is being seen today for flu like sx, fever, cough, body aches and chills since Monday. Need note for work and cough meds. In no distress, no wheezing or sob. 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   HLD (hyperlipidemia) 09/03/2021   Other fatigue 09/02/2021   SOBOE (shortness of breath on exertion) 09/02/2021   Prediabetes 09/02/2021   Osteoarthritis of right knee 09/02/2021   Vitamin D deficiency 09/02/2021   Depression 09/02/2021   At risk for diabetes mellitus 09/02/2021   Screening, lipid 09/02/2021   Gestational diabetes 04/08/2013   Status post repeat low transverse cesarean section 04/07/2013   Preeclampsia 04/05/2013   Dense breasts 04/06/2012   Breast calcification, left 04/06/2012   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    Allergies: No Known Allergies Medications:  Current Outpatient Medications:    promethazine-dextromethorphan (PROMETHAZINE-DM) 6.25-15 MG/5ML syrup, Take 5 mLs by mouth 4 (four) times daily as needed for up to 10 days for cough., Disp: 118 mL, Rfl: 0   atorvastatin (LIPITOR) 20 MG tablet, Take 1 tablet (20 mg total) by mouth daily.,  Disp: 30 tablet, Rfl: 0   BIOTIN PO, Take by mouth., Disp: , Rfl:    ferrous gluconate (FERGON) 324 MG tablet, Take 1 tablet (324 mg total) by mouth daily., Disp: 30 tablet, Rfl: 0   Multiple Vitamin (MULTIVITAMIN ADULT PO), Take by mouth., Disp: , Rfl:    OVER THE COUNTER MEDICATION, Anti-inflammatory, Disp: , Rfl:    Semaglutide,0.25 or 0.5MG /DOS, (OZEMPIC, 0.25 OR 0.5  MG/DOSE,) 2 MG/3ML SOPN, Inject 0.5 mg into the skin once a week., Disp: 3 mL, Rfl: 0   Vitamin D, Ergocalciferol, (DRISDOL) 1.25 MG (50000 UNIT) CAPS capsule, Take 1 capsule (50,000 Units total) by mouth every 7 (seven) days., Disp: 5 capsule, Rfl: 0  Observations/Objective: Patient is well-developed, well-nourished in no acute distress.  Resting comfortably  at home.  Head is normocephalic, atraumatic.  No labored breathing.  Speech is clear and coherent with logical content.  Patient is alert and oriented at baseline.    Assessment and Plan: 1. Flu-like symptoms (Primary)  Increase fluids, humidifier at night, Urgent care as needed.   Follow Up Instructions: I discussed the assessment and treatment plan with the patient. The patient was provided an opportunity to ask questions and all were answered. The patient agreed with the plan and demonstrated an understanding of the instructions.  A copy of instructions were sent to the patient via MyChart unless otherwise noted below.     The patient was advised to call back or seek an in-person evaluation if the symptoms worsen or if the condition fails to improve as anticipated.    Georgana Curio, FNP

## 2023-09-08 ENCOUNTER — Ambulatory Visit
Admission: EM | Admit: 2023-09-08 | Discharge: 2023-09-08 | Disposition: A | Discharge status: Home / Self Care | Attending: Physician Assistant | Admitting: Physician Assistant

## 2023-09-08 ENCOUNTER — Other Ambulatory Visit: Payer: Self-pay

## 2023-09-08 DIAGNOSIS — R109 Unspecified abdominal pain: Secondary | ICD-10-CM

## 2023-09-08 DIAGNOSIS — Z113 Encounter for screening for infections with a predominantly sexual mode of transmission: Secondary | ICD-10-CM | POA: Diagnosis present

## 2023-09-08 DIAGNOSIS — N390 Urinary tract infection, site not specified: Secondary | ICD-10-CM | POA: Insufficient documentation

## 2023-09-08 DIAGNOSIS — R319 Hematuria, unspecified: Secondary | ICD-10-CM | POA: Diagnosis present

## 2023-09-08 LAB — POC COVID19/FLU A&B COMBO
Covid Antigen, POC: NEGATIVE
Influenza A Antigen, POC: NEGATIVE
Influenza B Antigen, POC: NEGATIVE

## 2023-09-08 LAB — POCT URINALYSIS DIP (MANUAL ENTRY)
Bilirubin, UA: NEGATIVE
Glucose, UA: NEGATIVE mg/dL
Ketones, POC UA: NEGATIVE mg/dL
Leukocytes, UA: NEGATIVE
Nitrite, UA: POSITIVE — AB
Protein Ur, POC: 30 mg/dL — AB
Spec Grav, UA: >=1.03 — AB (ref 1.010–1.025)
Urobilinogen, UA: 1 U/dL
pH, UA: 5.5 (ref 5.0–8.0)

## 2023-09-08 LAB — POCT URINE PREGNANCY: Preg Test, Ur: NEGATIVE

## 2023-09-08 MED ORDER — IBUPROFEN 800 MG PO TABS
800.0000 mg | ORAL_TABLET | Freq: Once | ORAL | Status: AC
  Administered 2023-09-08: 800 mg via ORAL

## 2023-09-08 MED ORDER — NITROFURANTOIN MONOHYD MACRO 100 MG PO CAPS: 100.0000 mg | ORAL_CAPSULE | Freq: Two times a day (BID) | ORAL | 0 refills | Status: AC

## 2023-09-08 NOTE — ED Provider Notes (Signed)
 [facility]    CSN: [id number] Arrival date & time: 09/08/23  1714      History   Chief Complaint Chief Complaint  Patient presents with   Abdominal Cramping    HPI Pamela Gray  is a 53 y.o. female.   HPI  Pt reports concerns for abdominal cramping, inner thigh soreness, chills and night sweats that started yesterday  She thinks she may be starting her menstrual period as she had some blood while completing the vaginal swab She also reports some numbness in both arms - usually happening in her hands. She reports this happens when she gets too cold and the numbness resolves when she warms up     Past Medical History:  Diagnosis Date   Anemia    Arthritis of knee    Depression    Prediabetes    SOBOE (shortness of breath on exertion)     Patient Active Problem List   Diagnosis Date Noted   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   HLD (hyperlipidemia) 09/03/2021   Other fatigue 09/02/2021   SOBOE (shortness of breath on exertion) 09/02/2021   Prediabetes 09/02/2021   Osteoarthritis of right knee 09/02/2021   Vitamin D  deficiency 09/02/2021   Depression 09/02/2021   At risk for diabetes mellitus 09/02/2021   Screening, lipid 09/02/2021   Gestational diabetes 04/08/2013   Status post repeat low transverse cesarean section 04/07/2013   Preeclampsia 04/05/2013   Dense breasts 04/06/2012   Breast calcification, left 04/06/2012   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    Past Surgical History:  Procedure Laterality Date   BILATERAL SALPINGECTOMY  04/04/2013   Procedure: BILATERAL SALPINGECTOMY;  Surgeon: Rome Rigg, MD;  Location: [facility];  Service: Obstetrics;;   CESAREAN SECTION     CESAREAN SECTION N/A 04/04/2013   Procedure: CESAREAN SECTION;  Surgeon: Rome Rigg, MD;  Location: [facility];  Service: Obstetrics;  Laterality: N/A;   SMALL INTESTINE SURGERY      OB History     Gravida  4   Para  2   Term  2   Preterm       AB  1   Living  2      SAB      IAB      Ectopic      Multiple      Live Births  2            Home Medications    Prior to Admission medications   Medication Sig Start Date End Date Taking? Authorizing Provider  nitrofurantoin , macrocrystal-monohydrate, (MACROBID ) 100 MG capsule Take 1 capsule (100 mg total) by mouth 2 (two) times daily for 5 days. 09/08/23 09/13/23 Yes Aston Lieske E, PA-C  atorvastatin  (LIPITOR) 20 MG tablet Take 1 tablet (20 mg total) by mouth daily. 10/08/21   Bowen, Darice BRAVO, DO  BIOTIN PO Take by mouth.    [provider]  ferrous gluconate  (FERGON) 324 MG tablet Take 1 tablet (324 mg total) by mouth daily. 09/16/21   Bowen, Darice BRAVO, DO  Multiple Vitamin (MULTIVITAMIN ADULT PO) Take by mouth.    [provider]  OVER THE COUNTER MEDICATION Anti-inflammatory    [provider]  Semaglutide ,0.25 or 0.5MG /DOS, (OZEMPIC , 0.25 OR 0.5 MG/DOSE,) 2 MG/3ML SOPN Inject 0.5 mg into the skin once a week. 10/08/21   Bowen, Darice BRAVO, DO  Vitamin D , Ergocalciferol , (DRISDOL ) 1.25 MG (50000 UNIT) CAPS capsule Take 1 capsule (50,000 Units total) by mouth every 7 (  seven) days. 10/08/21   Bowen, Darice BRAVO, DO    Family History Family History  Problem Relation Age of Onset   Hypertension Mother    Hyperlipidemia Mother    Depression Mother    Anxiety disorder Mother    Cancer Father    Stroke Father    Hypertension Father    Diabetes Father    Prostate cancer Father    Heart disease Father    Sudden death Father    Kidney disease Father    Obesity Father    Diabetes Maternal Grandmother    Hypertension Maternal Grandmother    Hyperlipidemia Maternal Grandmother    Diabetes Maternal Grandfather    Prostate cancer Maternal Grandfather    Diabetes Paternal Grandfather    Prostate cancer Paternal Grandfather    Hypertension Paternal Grandfather     Social History Social History   Tobacco Use   Smoking status: Former    Current  packs/day: 0.00    Types: Cigarettes    Quit date: 2009    Years since quitting: 16.5   Smokeless tobacco: Never  Vaping Use   Vaping status: Never Used  Substance Use Topics   Alcohol use: Yes    Alcohol/week: 8.0 standard drinks of alcohol    Types: 7 Glasses of wine, 1 Shots of liquor per week   Drug use: No     Allergies   Patient has no known allergies.   Review of Systems Review of Systems  Constitutional:  Positive for chills and diaphoresis. Negative for fatigue and fever.  HENT:  Negative for congestion and sore throat.   Respiratory:  Negative for shortness of breath and wheezing.   Gastrointestinal:  Positive for abdominal pain (suprapubic cramping). Negative for diarrhea, nausea and vomiting.  Genitourinary:  Positive for vaginal bleeding. Negative for difficulty urinating, dysuria, flank pain, vaginal discharge and vaginal pain.  Musculoskeletal:  Positive for myalgias.     Physical Exam Triage Vital Signs ED Triage Vitals  Encounter Vitals Group     BP 09/08/23 1733 118/82     Girls Systolic BP Percentile --      Girls Diastolic BP Percentile --      Boys Systolic BP Percentile --      Boys Diastolic BP Percentile --      Pulse Rate 09/08/23 1733 (!) 109     Resp 09/08/23 1733 19     Temp 09/08/23 1733 (!) 100.4 F (38 C)     Temp Source 09/08/23 1733 Oral     SpO2 09/08/23 1733 96 %     Weight 09/08/23 1735 210 lb (95.3 kg)     Height 09/08/23 1735 5' 9 (1.753 m)     Head Circumference --      Peak Flow --      Pain Score 09/08/23 1735 2     Pain Loc --      Pain Education --      Exclude from Growth Chart --    No data found.  Updated Vital Signs BP 118/82 (BP Location: Right Arm)   Pulse (!) 109   Temp (!) 100.4 F (38 C) (Oral)   Resp 19   Ht 5' 9 (1.753 m)   Wt 210 lb (95.3 kg)   LMP 08/27/2023 (Exact Date)   SpO2 96%   BMI 31.01 kg/m   Visual Acuity Right Eye Distance:   Left Eye Distance:   Bilateral Distance:    Right  Eye Near:  Left Eye Near:    Bilateral Near:     Physical Exam Vitals reviewed.  Constitutional:      General: She is awake. She is not in acute distress.    Appearance: Normal appearance. She is well-developed and well-groomed. She is not ill-appearing or toxic-appearing.  HENT:     Head: Normocephalic and atraumatic.  Eyes:     General: Lids are normal. Gaze aligned appropriately.     Extraocular Movements: Extraocular movements intact.     Conjunctiva/sclera: Conjunctivae normal.  Cardiovascular:     Rate and Rhythm: Normal rate and regular rhythm.     Heart sounds: Normal heart sounds. No murmur heard.    No friction rub. No gallop.  Pulmonary:     Effort: Pulmonary effort is normal.     Breath sounds: Normal breath sounds. No decreased air movement. No wheezing, rhonchi or rales.  Abdominal:     General: Abdomen is flat. Bowel sounds are normal.     Palpations: Abdomen is soft.     Tenderness: There is no abdominal tenderness. There is no guarding.  Musculoskeletal:     Cervical back: Normal range of motion.  Skin:    General: Skin is warm and dry.  Neurological:     Mental Status: She is alert and oriented to person, place, and time.  Psychiatric:        Attention and Perception: Attention and perception normal.        Mood and Affect: Mood and affect normal.        Speech: Speech normal.        Behavior: Behavior normal. Behavior is cooperative.      UC Treatments / Results  Labs (all labs ordered are listed, but only abnormal results are displayed) Labs Reviewed  POCT URINALYSIS DIP (MANUAL ENTRY) - Abnormal; Notable for the following components:      Result Value   Clarity, UA cloudy (*)    Spec Grav, UA >=1.030 (*)    Blood, UA moderate (*)    Protein Ur, POC =30 (*)    Nitrite, UA Positive (*)    All other components within normal limits  POCT URINE PREGNANCY - Normal  URINE CULTURE  RPR  HIV ANTIBODY (ROUTINE TESTING W REFLEX)  POC COVID19/FLU A&B  COMBO  CERVICOVAGINAL ANCILLARY ONLY    EKG   Radiology No results found.  Procedures Procedures (including critical care time)  Medications Ordered in UC Medications  ibuprofen  (ADVIL ) tablet 800 mg (800 mg Oral Given 09/08/23 1800)    Initial Impression / Assessment and Plan / UC Course  I have reviewed the triage vital signs and the nursing notes.  Pertinent labs & imaging results that were available during my care of the patient were reviewed by me and considered in my medical decision making (see chart for details).      Final Clinical Impressions(s) / UC Diagnoses   Final diagnoses:  Screening examination for STD (sexually transmitted disease)  Abdominal cramping  Urinary tract infection with hematuria, site unspecified   Patient presents today with concerns for abdominal cramping, thigh soreness, chills and night sweats that started yesterday.  She is also requesting STD workup for screening and denies current symptoms today.  Vitals and physical exam are overall reassuring with the exception of mildly elevated temperature and slight tachycardia.  Urine dip is notable for nitrites and blood.  Will send urine culture off for definitive rule out.  Will start treatment for suspected UTI with  nitrofurantoin  100 mg p.o. twice daily x 5 days.  Results today further management.  Will collect cervix vaginal swab to assess for BV, trichomoniasis, yeast, gonorrhea, chlamydia.  Will also get blood work to assess for HIV and syphilis.  Patient made aware that she will be contacted for any positive results and treatment will be sent in her she will be contacted to schedule an appointment for appropriate injection.  Follow-up as needed for progressing or persistent symptoms    Discharge Instructions      Based on your symptoms and results of the urinalysis I believe you have a UTI I recommend the following:  I have sent in a script for macrobid  100 mg to be taken by mouth twice per  day for 5 days  Please finish the entire course of the antibiotic even if you are feeling better before it is completed unless you develop an allergic reaction or are told by a medical provider to stop taking it. We have sent a sample of your urine off for a urine culture.  This will help us  determine what bacteria is causing your symptoms as well as the most appropriate antibiotic to treat it.  If we need to make any adjustments to your medication regimen and new medication will be sent to the pharmacy on file and you will be updated via phone call in MyChart. Stay well hydrated (at least 75 oz of water per day) and avoid holding your urine for prolonged periods of time. If you have any of the following please return to urgent care or go to the emergency room: Persistent symptoms, fever, trouble urinating or inability to urinate, confusion, flank pain.   You were seen today for STD screening.  We collected a cervicovaginal swab that we will assess for gonorrhea, chlamydia, trichomonas, bacterial vaginosis, yeast.  If collected blood work that we will assess for HIV and syphilis.  We will keep you updated with these results once they are available.  If any medications are indicated by those test results we will call you and medications will either be sent to the pharmacy on file or you can return to the urgent care for an injection.  It is recommended that you refrain from sexual activity until your test results are negative or until you have completed an appropriate medication regimen as dictated by your test results.  Please use a condom or another barrier method to help prevent STD transmission.  Please make sure that you communicate with your partners regarding your test results should any positive results, about as they will also need to be tested and screened.       ED Prescriptions     Medication Sig Dispense Auth. Provider   nitrofurantoin , macrocrystal-monohydrate, (MACROBID ) 100 MG capsule  Take 1 capsule (100 mg total) by mouth 2 (two) times daily for 5 days. 10 capsule Wanita Derenzo E, PA-C      PDMP not reviewed this encounter.   Marylene Rocky BRAVO, PA-C 09/09/23 1846

## 2023-09-08 NOTE — ED Triage Notes (Signed)
 Pt presents with complaints of abdominal cramping, soreness in thighs, fatigue, night sweats, and chills x 1 day. Denies sick contacts. Currently rates overall pain a 2/10. Pt explains the abdominal cramping feels like she is on menstrual cycle.   Pt states she would also like STD testing today. Denies symptoms. Requesting both, blood work and vaginal swab.

## 2023-09-08 NOTE — Discharge Instructions (Addendum)
 Based on your symptoms and results of the urinalysis I believe you have a UTI I recommend the following:  I have sent in a script for macrobid  100 mg to be taken by mouth twice per day for 5 days  Please finish the entire course of the antibiotic even if you are feeling better before it is completed unless you develop an allergic reaction or are told by a medical provider to stop taking it. We have sent a sample of your urine off for a urine culture.  This will help us  determine what bacteria is causing your symptoms as well as the most appropriate antibiotic to treat it.  If we need to make any adjustments to your medication regimen and new medication will be sent to the pharmacy on file and you will be updated via phone call in MyChart. Stay well hydrated (at least 75 oz of water per day) and avoid holding your urine for prolonged periods of time. If you have any of the following please return to urgent care or go to the emergency room: Persistent symptoms, fever, trouble urinating or inability to urinate, confusion, flank pain.   You were seen today for STD screening.  We collected a cervicovaginal swab that we will assess for gonorrhea, chlamydia, trichomonas, bacterial vaginosis, yeast.  If collected blood work that we will assess for HIV and syphilis.  We will keep you updated with these results once they are available.  If any medications are indicated by those test results we will call you and medications will either be sent to the pharmacy on file or you can return to the urgent care for an injection.  It is recommended that you refrain from sexual activity until your test results are negative or until you have completed an appropriate medication regimen as dictated by your test results.  Please use a condom or another barrier method to help prevent STD transmission.  Please make sure that you communicate with your partners regarding your test results should any positive results, about as they will  also need to be tested and screened.

## 2023-09-09 LAB — CERVICOVAGINAL ANCILLARY ONLY
Bacterial Vaginitis (gardnerella): NEGATIVE
Candida Glabrata: NEGATIVE
Candida Vaginitis: POSITIVE — AB
Chlamydia: NEGATIVE
Comment: NEGATIVE
Comment: NEGATIVE
Comment: NEGATIVE
Comment: NEGATIVE
Comment: NEGATIVE
Comment: NORMAL
Neisseria Gonorrhea: NEGATIVE
Trichomonas: NEGATIVE

## 2023-09-10 ENCOUNTER — Ambulatory Visit (HOSPITAL_COMMUNITY): Payer: Self-pay

## 2023-09-10 LAB — RPR: RPR Ser Ql: NONREACTIVE

## 2023-09-10 LAB — URINE CULTURE: Culture: >=100000 — AB

## 2023-09-10 LAB — HIV ANTIBODY (ROUTINE TESTING W REFLEX): HIV Screen 4th Generation wRfx: NONREACTIVE

## 2023-09-10 MED ORDER — FLUCONAZOLE 150 MG PO TABS: 150.0000 mg | ORAL_TABLET | Freq: Once | ORAL | 0 refills | Status: AC

## 2023-09-10 MED ORDER — SULFAMETHOXAZOLE-TRIMETHOPRIM 800-160 MG PO TABS: 1.0000 | ORAL_TABLET | Freq: Two times a day (BID) | ORAL | 0 refills | Status: AC

## 2024-03-02 ENCOUNTER — Ambulatory Visit
Admission: RE | Admit: 2024-03-02 | Discharge: 2024-03-02 | Disposition: A | Discharge status: Home / Self Care | Source: Ambulatory Visit

## 2024-03-02 ENCOUNTER — Ambulatory Visit: Payer: Self-pay

## 2024-03-02 VITALS — BP 128/87 | HR 75 | Temp 97.6°F | Resp 16

## 2024-03-02 DIAGNOSIS — R5383 Other fatigue: Secondary | ICD-10-CM

## 2024-03-02 DIAGNOSIS — R42 Dizziness and giddiness: Secondary | ICD-10-CM

## 2024-03-02 DIAGNOSIS — Z862 Personal history of diseases of the blood and blood-forming organs and certain disorders involving the immune mechanism: Secondary | ICD-10-CM

## 2024-03-02 NOTE — ED Provider Notes (Signed)
 " [facility]    CSN: [id number] Arrival date & time: 03/02/24  1703      History   Chief Complaint Chief Complaint  Patient presents with   Dizziness    Fatigue - Entered by patient    HPI Pamela Gray  is a 54 y.o. female.   Pamela Gray is a 54 year old female with past medical history of iron deficiency anemia who presents today with complaint of fatigue for 6 months.  She reports that she wakes up feeling  tired, feels tired throughout the day, and goes to bed tired.  This has progressively gotten worse despite getting 6 to 8 hours of sleep every night.  2 weeks ago she developed dizziness that has worsened over the last 2 weeks.  She describes the dizziness as feeling off-kilter.  It is intermittent, and lasts seconds to up to 30 minutes.  She denies a sensation of herself spinning or the room spinning.  The dizziness is not worsened with positional changes, turning her head, bending over.  She reports that she has been unable to identify any causative agents.  Additionally, she has not found anything that makes the dizziness better.  She states that she feels dizzy often when she begins walking, but this improves after taking a few steps.  The dizziness and fatigue are not associated with headaches, vision changes, ear pain, tinnitus, hearing changes, chest pain, shortness of breath, heart palpitations, syncope, weakness, numbness, tingling, nausea, vomiting.  She denies any cardiovascular history.  She has a history of prediabetes, but is not on any medications for this.  She reports that despite fatigue and dizziness, she has been able to continue her exercise regimen which is 10 minutes on the treadmill followed by strength training.  She states that dizziness is not worse with exercise, and again denies cardiovascular symptoms.  She has taken an iron supplement inconsistently.  She is unsure of the dosage, but reports that it is a gummy.  She reports that she takes this 3 days a  week.  She denies any changes in medications.  She does not currently have a PCP, but is followed by her OB/GYN.  The history is provided by the patient.  Dizziness Quality:  Lightheadedness Severity:  Mild Timing:  Sporadic Progression:  Waxing and waning Chronicity:  New Context: not when bending over, not with bowel movement, not with ear pain, not with eye movement, not with head movement, not with inactivity, not with loss of consciousness, not with medication, not with physical activity, not when standing up and not when urinating   Relieved by:  Nothing Worsened by:  Nothing Ineffective treatments:  Change in position, fluids, turning head, being still and closing eyes Associated symptoms: no blood in stool, no chest pain, no diarrhea, no headaches, no hearing loss, no nausea, no palpitations, no shortness of breath, no syncope, no tinnitus, no vision changes, no vomiting and no weakness   Risk factors: anemia and hx of vertigo   Risk factors: no heart disease, no hx of stroke, no multiple medications and no new medications     Past Medical History:  Diagnosis Date   Anemia    Arthritis of knee    Depression    Prediabetes    SOBOE (shortness of breath on exertion)     Patient Active Problem List   Diagnosis Date Noted   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   HLD (hyperlipidemia) 09/03/2021   Other fatigue 09/02/2021   SOBOE (shortness of breath on  exertion) 09/02/2021   Prediabetes 09/02/2021   Osteoarthritis of right knee 09/02/2021   Vitamin D  deficiency 09/02/2021   Depression 09/02/2021   At risk for diabetes mellitus 09/02/2021   Screening, lipid 09/02/2021   Gestational diabetes 04/08/2013   Status post repeat low transverse cesarean section 04/07/2013   Preeclampsia 04/05/2013   Dense breasts 04/06/2012   Breast calcification, left 04/06/2012   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    Past Surgical History:  Procedure Laterality Date    BILATERAL SALPINGECTOMY  04/04/2013   Procedure: BILATERAL SALPINGECTOMY;  Surgeon: Rome Rigg, MD;  Location: [facility];  Service: Obstetrics;;   CESAREAN SECTION     CESAREAN SECTION N/A 04/04/2013   Procedure: CESAREAN SECTION;  Surgeon: Rome Rigg, MD;  Location: [facility];  Service: Obstetrics;  Laterality: N/A;   SMALL INTESTINE SURGERY      OB History     Gravida  4   Para  2   Term  2   Preterm      AB  1   Living  2      SAB      IAB      Ectopic      Multiple      Live Births  2            Home Medications    Prior to Admission medications  Medication Sig Start Date End Date Taking? Authorizing Provider  atorvastatin  (LIPITOR) 20 MG tablet Take 1 tablet (20 mg total) by mouth daily. Patient not taking: Reported on 03/02/2024 10/08/21   Bowen, Darice BRAVO, DO  BIOTIN PO Take by mouth.    [provider]  ferrous gluconate  (FERGON) 324 MG tablet Take 1 tablet (324 mg total) by mouth daily. 09/16/21   Bowen, Darice BRAVO, DO  MOUNJARO  2.5 MG/0.5ML Pen INJECT 2.5 MG INTO THE SKIN ONCE WEEKLY Patient not taking: Reported on 03/02/2024 06/25/23   [provider]  Multiple Vitamin (MULTIVITAMIN ADULT PO) Take by mouth.    [provider]  OVER THE COUNTER MEDICATION Anti-inflammatory    [provider]  Semaglutide ,0.25 or 0.5MG /DOS, (OZEMPIC , 0.25 OR 0.5 MG/DOSE,) 2 MG/3ML SOPN Inject 0.5 mg into the skin once a week. Patient not taking: Reported on 03/02/2024 10/08/21   Bowen, Darice BRAVO, DO  Vitamin D , Ergocalciferol , (DRISDOL ) 1.25 MG (50000 UNIT) CAPS capsule Take 1 capsule (50,000 Units total) by mouth every 7 (seven) days. 10/08/21   Bowen, Darice BRAVO, DO    Family History Family History  Problem Relation Age of Onset   Hypertension Mother    Hyperlipidemia Mother    Depression Mother    Anxiety disorder Mother    Cancer Father    Stroke Father    Hypertension Father    Diabetes Father    Prostate cancer Father    Heart disease  Father    Sudden death Father    Kidney disease Father    Obesity Father    Diabetes Maternal Grandmother    Hypertension Maternal Grandmother    Hyperlipidemia Maternal Grandmother    Diabetes Maternal Grandfather    Prostate cancer Maternal Grandfather    Diabetes Paternal Grandfather    Prostate cancer Paternal Grandfather    Hypertension Paternal Grandfather     Social History Social History[1]   Allergies   Patient has no known allergies.   Review of Systems Review of Systems  Constitutional:  Positive for fatigue. Negative for activity change, appetite change, chills, diaphoresis,  fever and unexpected weight change.  HENT:  Negative for ear pain, hearing loss and tinnitus.   Eyes:  Negative for photophobia and visual disturbance.  Respiratory:  Negative for chest tightness and shortness of breath.   Cardiovascular:  Negative for chest pain, palpitations, leg swelling and syncope.  Gastrointestinal:  Negative for blood in stool, diarrhea, nausea and vomiting.  Neurological:  Positive for dizziness and light-headedness. Negative for tremors, seizures, syncope, speech difficulty, weakness, numbness and headaches.  Hematological:  Does not bruise/bleed easily.     Physical Exam Triage Vital Signs ED Triage Vitals [03/02/24 1719]  Encounter Vitals Group     BP 128/87     Girls Systolic BP Percentile      Girls Diastolic BP Percentile      Boys Systolic BP Percentile      Boys Diastolic BP Percentile      Pulse Rate 75     Resp 16     Temp 97.6 F (36.4 C)     Temp Source Oral     SpO2 97 %     Weight      Height      Head Circumference      Peak Flow      Pain Score 0     Pain Loc      Pain Education      Exclude from Growth Chart    No data found.  Updated Vital Signs BP 128/87 (BP Location: Right Arm)   Pulse 75   Temp 97.6 F (36.4 C) (Oral)   Resp 16   LMP 02/21/2024 (Approximate)   SpO2 97%   Visual Acuity Right Eye Distance:   Left Eye  Distance:   Bilateral Distance:    Right Eye Near:   Left Eye Near:    Bilateral Near:     Physical Exam Vitals and nursing note reviewed.  Constitutional:      General: She is not in acute distress.    Appearance: Normal appearance. She is normal weight. She is not toxic-appearing.  HENT:     Mouth/Throat:     Tonsils: 1+ on the right. 1+ on the left.  Eyes:     General: Lids are normal.     Extraocular Movements: Extraocular movements intact.     Right eye: Normal extraocular motion and no nystagmus.     Left eye: Normal extraocular motion and no nystagmus.     Conjunctiva/sclera: Conjunctivae normal.     Right eye: Right conjunctiva is not injected. No hemorrhage.    Left eye: Left conjunctiva is not injected. No hemorrhage.    Comments: Palpebral conjunctive up are pale  Cardiovascular:     Rate and Rhythm: Normal rate and regular rhythm.     Pulses:          Radial pulses are 2+ on the right side and 2+ on the left side.       Dorsalis pedis pulses are 2+ on the right side and 2+ on the left side.       Posterior tibial pulses are 2+ on the right side and 2+ on the left side.     Heart sounds: Normal heart sounds.  Pulmonary:     Effort: Pulmonary effort is normal. No accessory muscle usage or respiratory distress.     Breath sounds: Normal breath sounds and air entry.  Musculoskeletal:     Lumbar back: Decreased range of motion: Guarded due to pain. Negative right straight leg raise  test and negative left straight leg raise test.     Right lower leg: No edema.     Left lower leg: No edema.  Lymphadenopathy:     Cervical:     Right cervical: No posterior cervical adenopathy.    Left cervical: No posterior cervical adenopathy.  Skin:    General: Skin is warm and dry.     Findings: No rash.  Neurological:     Mental Status: She is alert and oriented to person, place, and time.     GCS: GCS eye subscore is 4. GCS verbal subscore is 5. GCS motor subscore is 6.      Cranial Nerves: Cranial nerves 2-12 are intact.     Sensory: Sensation is intact.     Motor: Motor function is intact. No weakness or pronator drift.     Coordination: Coordination is intact. Romberg sign negative. Finger-Nose-Finger Test normal.     Gait: Gait is intact.  Psychiatric:        Mood and Affect: Mood normal.        Behavior: Behavior normal.      UC Treatments / Results  Labs (all labs ordered are listed, but only abnormal results are displayed) Labs Reviewed  CBC  IRON AND TIBC    EKG   Radiology No results found.  Procedures Procedures (including critical care time)  Medications Ordered in UC Medications - No data to display  Initial Impression / Assessment and Plan / UC Course  I have reviewed the triage vital signs and the nursing notes.  Pertinent labs & imaging results that were available during my care of the patient were reviewed by me and considered in my medical decision making (see chart for details).     1. Iron Deficiency Anemia: The patient has a known history of iron deficiency anemia and has been inconsistently taking iron supplements. The pale conjunctiva and symptoms of fatigue and dizziness are consistent with anemia. Blood work was last checked in 2023, suggesting a need for updated lab results to assess current hemoglobin and iron levels.  2. Dizziness: The patient's dizziness does not appear to be related to cardiac or neurological causes based on the physical exam and history. The pattern and description of dizziness are more suggestive of anemia-related symptoms. There is no indication of vertigo or ear dysfunction from the current assessment.   PLAN: The patient was advised to take iron gummies daily to address iron deficiency anemia. - Dietary recommendations included increasing intake of red meat and leafy green vegetables for additional iron.  Ordered a complete blood count (CBC) and iron studies to assess current anemia status and  iron levels.   Discussed the importance of consistent iron supplementation and dietary sources of iron.  - Recommended follow-up with OB-GYN for ongoing management of anemia until a primary care physician is established.  Suggested the patient establish care with a primary care physician as a New Year's resolution.  Final Clinical Impressions(s) / UC Diagnoses   Final diagnoses:  Fatigue, unspecified type  Intermittent lightheadedness  History of iron deficiency anemia     Discharge Instructions       Your symptoms (fatigue and recent lightheadedness), exam findings, and history of anemia are most consistent with iron-deficiency anemia. Iron is needed to make healthy red blood cells that carry oxygen. When iron is low, oxygen delivery drops, which can cause fatigue, weakness, dizziness, and shortness of breath. You have not had blood work in several years and have  been taking iron inconsistently, which may be contributing. Plan Labs today: CBC and iron studies to confirm iron deficiency and assess severity Iron supplementation: Continue your iron gummy and take it consistently every day Best absorbed when taken with vitamin C (orange juice or citrus) Avoid taking at the same time as calcium , dairy, or antacids Diet: Increase iron-rich foods such as red meat, poultry, fish, beans, lentils, spinach, fortified cereals, and tofu Follow-up: Review results with your PCP and discuss next steps, including dose adjustments or further evaluation if needed What to expect Energy and lightheadedness often improve gradually over weeks, not days, once iron levels are restored. Blood counts may take 1-3 months to normalize, and iron is often continued longer to fully replenish stores. When to seek care Seek urgent or emergency care if you develop: Chest pain, shortness of breath, fainting, or near-fainting Worsening or severe dizziness Black or bloody stools, vomiting blood New or rapidly worsening  weakness or fatigue      ED Prescriptions   None    PDMP not reviewed this encounter.     [1]  Social History Tobacco Use   Smoking status: Former    Current packs/day: 0.00    Types: Cigarettes    Quit date: 2009    Years since quitting: 17.0   Smokeless tobacco: Never  Vaping Use   Vaping status: Never Used  Substance Use Topics   Alcohol use: Yes    Alcohol/week: 8.0 standard drinks of alcohol    Types: 7 Glasses of wine, 1 Shots of liquor per week   Drug use: No     Leatrice Vernell HERO, NP 03/02/24 1827  "

## 2024-03-02 NOTE — Discharge Instructions (Signed)
" °  Your symptoms (fatigue and recent lightheadedness), exam findings, and history of anemia are most consistent with iron-deficiency anemia. Iron is needed to make healthy red blood cells that carry oxygen. When iron is low, oxygen delivery drops, which can cause fatigue, weakness, dizziness, and shortness of breath. You have not had blood work in several years and have been taking iron inconsistently, which may be contributing. Plan Labs today: CBC and iron studies to confirm iron deficiency and assess severity Iron supplementation: Continue your iron gummy and take it consistently every day Best absorbed when taken with vitamin C (orange juice or citrus) Avoid taking at the same time as calcium , dairy, or antacids Diet: Increase iron-rich foods such as red meat, poultry, fish, beans, lentils, spinach, fortified cereals, and tofu Follow-up: Review results with your PCP and discuss next steps, including dose adjustments or further evaluation if needed What to expect Energy and lightheadedness often improve gradually over weeks, not days, once iron levels are restored. Blood counts may take 1-3 months to normalize, and iron is often continued longer to fully replenish stores. When to seek care Seek urgent or emergency care if you develop: Chest pain, shortness of breath, fainting, or near-fainting Worsening or severe dizziness Black or bloody stools, vomiting blood New or rapidly worsening weakness or fatigue  "

## 2024-03-02 NOTE — ED Triage Notes (Signed)
 Pt c/o dizziness for 2 weeks and more fatigued than usual for about 6 months.   States she has had vertigo in the past.

## 2024-03-03 ENCOUNTER — Ambulatory Visit: Payer: Self-pay

## 2024-03-03 LAB — IRON AND TIBC
Iron Saturation: 8 % — CL (ref 15–55)
Iron: 32 ug/dL (ref 27–159)
Total Iron Binding Capacity: 422 ug/dL (ref 250–450)
UIBC: 390 ug/dL (ref 131–425)

## 2024-03-03 LAB — CBC
Hematocrit: 34.1 % (ref 34.0–46.6)
Hemoglobin: 10.3 g/dL — ABNORMAL LOW (ref 11.1–15.9)
MCH: 21.7 pg — ABNORMAL LOW (ref 26.6–33.0)
MCHC: 30.2 g/dL — ABNORMAL LOW (ref 31.5–35.7)
MCV: 72 fL — ABNORMAL LOW (ref 79–97)
Platelets: 600 x10E3/uL — ABNORMAL HIGH (ref 150–450)
RBC: 4.75 x10E6/uL (ref 3.77–5.28)
RDW: 18 % — ABNORMAL HIGH (ref 11.7–15.4)
WBC: 8.6 x10E3/uL (ref 3.4–10.8)

## 2024-03-18 ENCOUNTER — Telehealth: Admitting: Family

## 2024-03-18 DIAGNOSIS — J069 Acute upper respiratory infection, unspecified: Secondary | ICD-10-CM | POA: Diagnosis not present

## 2024-03-18 MED ORDER — FLUTICASONE PROPIONATE 50 MCG/ACT NA SUSP: 2.0000 | Freq: Every day | NASAL | 6 refills | Status: AC

## 2024-03-18 MED ORDER — BENZONATATE 200 MG PO CAPS: 200.0000 mg | ORAL_CAPSULE | Freq: Two times a day (BID) | ORAL | 0 refills | Status: AC | PRN

## 2024-03-18 MED ORDER — CETIRIZINE HCL 10 MG PO TABS: 10.0000 mg | ORAL_TABLET | Freq: Every day | ORAL | 11 refills | Status: AC

## 2024-03-18 NOTE — Progress Notes (Signed)
 " Virtual Visit Consent   Pamela Gray , you are scheduled for a virtual visit with a [name] provider today. Just as with appointments in the office, your consent must be obtained to participate. Your consent will be active for this visit and any virtual visit you may have with one of our providers in the next 365 days. If you have a MyChart account, a copy of this consent can be sent to you electronically.  As this is a virtual visit, video technology does not allow for your provider to perform a traditional examination. This may limit your provider's ability to fully assess your condition. If your provider identifies any concerns that need to be evaluated in person or the need to arrange testing (such as labs, EKG, etc.), we will make arrangements to do so. Although advances in technology are sophisticated, we cannot ensure that it will always work on either your end or our end. If the connection with a video visit is poor, the visit may have to be switched to a telephone visit. With either a video or telephone visit, we are not always able to ensure that we have a secure connection.  By engaging in this virtual visit, you consent to the provision of healthcare and authorize for your insurance to be billed (if applicable) for the services provided during this visit. Depending on your insurance coverage, you may receive a charge related to this service.  I need to obtain your verbal consent now. Are you willing to proceed with your visit today? [name]  has provided verbal consent on 03/18/2024 for a virtual visit (video or telephone). [name], FNP  Date: 03/18/2024 12:06 PM   Virtual Visit via Video Note   I, [name], connected with  Pamela Gray   ([id number], [birth year]) on 03/18/24 at 12:00 PM EST by a video-enabled telemedicine application and verified that I am speaking with the correct person using two identifiers.  Location: Patient: Virtual Visit Location  Patient: Home Provider: Virtual Visit Location Provider: Home Office   I discussed the limitations of evaluation and management by telemedicine and the availability of in person appointments. The patient expressed understanding and agreed to proceed.    History of Present Illness: Pamela Gray  is a 54 y.o. who identifies as a female who was assigned female at birth, and is being seen today for congestion that started .  HPI: URI  This is a new problem. The current episode started in the past 7 days. The problem has been gradually improving. There has been no fever. Associated symptoms include congestion, headaches, rhinorrhea, sinus pain and sneezing. Pertinent negatives include no coughing, ear pain, joint pain, nausea or sore throat. She has tried acetaminophen  and decongestant for the symptoms. The treatment provided mild relief.    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   HLD (hyperlipidemia) 09/03/2021   Other fatigue 09/02/2021   SOBOE (shortness of breath on exertion) 09/02/2021   Prediabetes 09/02/2021   Osteoarthritis of right knee 09/02/2021   Vitamin D  deficiency 09/02/2021   Depression 09/02/2021   At risk for diabetes mellitus 09/02/2021   Screening, lipid 09/02/2021   Gestational diabetes 04/08/2013   Status post repeat low transverse cesarean section 04/07/2013   Preeclampsia 04/05/2013   Dense breasts 04/06/2012   Breast calcification, left 04/06/2012   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  Resting comfortably  at home.  Head is normocephalic, atraumatic.  No labored breathing.  Speech is clear and coherent with logical content.  Patient is alert and oriented at baseline.  Nasal congestion   Assessment and Plan: 1. Viral upper  respiratory tract infection (Primary) - cetirizine  (ZYRTEC ) 10 MG tablet; Take 1 tablet (10 mg total) by mouth daily.  Dispense: 30 tablet; Refill: 11 - fluticasone  (FLONASE ) 50 MCG/ACT nasal spray; Place 2 sprays into both nostrils daily.  Dispense: 16 g; Refill: 6 - benzonatate  (TESSALON ) 200 MG capsule; Take 1 capsule (200 mg total) by mouth 2 (two) times daily as needed for cough.  Dispense: 20 capsule; Refill: 0  - Take meds as prescribed - Use a cool mist humidifier  -Use saline nose sprays frequently -Force fluids -For any cough or congestion  Use plain Mucinex- regular strength or max strength is fine -For fever or aces or pains- take tylenol  or ibuprofen . -Throat lozenges if help -Follow up if symptoms worsen or do not improve  Follow Up Instructions: I discussed the assessment and treatment plan with the patient. The patient was provided an opportunity to ask questions and all were answered. The patient agreed with the plan and demonstrated an understanding of the instructions.  A copy of instructions were sent to the patient via MyChart unless otherwise noted below.     The patient was advised to call back or seek an in-person evaluation if the symptoms worsen or if the condition fails to improve as anticipated.    [name], FNP    [1] No Known Allergies [2]  Current Outpatient Medications:    benzonatate  (TESSALON ) 200 MG capsule, Take 1 capsule (200 mg total) by mouth 2 (two) times daily as needed for cough., Disp: 20 capsule, Rfl: 0   cetirizine  (ZYRTEC ) 10 MG tablet, Take 1 tablet (10 mg total) by mouth daily., Disp: 30 tablet, Rfl: 11   fluticasone  (FLONASE ) 50 MCG/ACT nasal spray, Place 2 sprays into both nostrils daily., Disp: 16 g, Rfl: 6   atorvastatin  (LIPITOR) 20 MG tablet, Take 1 tablet (20 mg total) by mouth daily. (Patient not taking: Reported on 03/02/2024), Disp: 30 tablet, Rfl: 0   BIOTIN PO, Take by mouth., Disp: , Rfl:    MOUNJARO  2.5 MG/0.5ML Pen,  INJECT 2.5 MG INTO THE SKIN ONCE WEEKLY (Patient not taking: Reported on 03/02/2024), Disp: , Rfl:    Multiple Vitamin (MULTIVITAMIN ADULT PO), Take by mouth., Disp: , Rfl:    OVER THE COUNTER MEDICATION, Anti-inflammatory, Disp: , Rfl:    Semaglutide ,0.25 or 0.5MG /DOS, (OZEMPIC , 0.25 OR 0.5 MG/DOSE,) 2 MG/3ML SOPN, Inject 0.5 mg into the skin once a week. (Patient not taking: Reported on 03/02/2024), Disp: 3 mL, Rfl: 0   Vitamin D , Ergocalciferol , (DRISDOL ) 1.25 MG (50000 UNIT) CAPS capsule, Take 1 capsule (50,000 Units total) by mouth every 7 (seven) days., Disp: 5 capsule, Rfl: 0  "

## 2024-03-18 NOTE — Patient Instructions (Signed)
